# Patient Record
Sex: Female | Born: 1965 | Race: White | Hispanic: No | Marital: Single | Smoking: Never smoker
Health system: Southern US, Community
[De-identification: ages and names within clinical notes are randomized; demographics above are authoritative.]

## PROBLEM LIST (undated history)

## (undated) DIAGNOSIS — F419 Anxiety disorder, unspecified: Secondary | ICD-10-CM

## (undated) DIAGNOSIS — F32A Depression, unspecified: Secondary | ICD-10-CM

## (undated) DIAGNOSIS — F329 Major depressive disorder, single episode, unspecified: Secondary | ICD-10-CM

## (undated) HISTORY — PX: GALLBLADDER SURGERY: SHX652

---

## 2014-02-18 ENCOUNTER — Encounter (HOSPITAL_COMMUNITY): Payer: Self-pay | Admitting: Emergency Medicine

## 2014-02-18 ENCOUNTER — Emergency Department (HOSPITAL_COMMUNITY)
Admission: EM | Admit: 2014-02-18 | Discharge: 2014-02-19 | Disposition: A | Discharge status: Other Acute Inpatient Hospital | Payer: Medicare Other | Attending: Emergency Medicine | Admitting: Emergency Medicine

## 2014-02-18 DIAGNOSIS — R45851 Suicidal ideations: Secondary | ICD-10-CM | POA: Insufficient documentation

## 2014-02-18 DIAGNOSIS — F32A Depression, unspecified: Secondary | ICD-10-CM

## 2014-02-18 DIAGNOSIS — F329 Major depressive disorder, single episode, unspecified: Secondary | ICD-10-CM

## 2014-02-18 DIAGNOSIS — F3289 Other specified depressive episodes: Secondary | ICD-10-CM | POA: Insufficient documentation

## 2014-02-18 DIAGNOSIS — Z3202 Encounter for pregnancy test, result negative: Secondary | ICD-10-CM | POA: Insufficient documentation

## 2014-02-18 DIAGNOSIS — Z79899 Other long term (current) drug therapy: Secondary | ICD-10-CM | POA: Insufficient documentation

## 2014-02-18 HISTORY — DX: Major depressive disorder, single episode, unspecified: F32.9

## 2014-02-18 HISTORY — DX: Anxiety disorder, unspecified: F41.9

## 2014-02-18 HISTORY — DX: Depression, unspecified: F32.A

## 2014-02-18 LAB — SALICYLATE LEVEL: Salicylate Lvl: <2 mg/dL — ABNORMAL LOW (ref 2.8–20.0)

## 2014-02-18 LAB — CBC
HCT: 39.1 % (ref 36.0–46.0)
HEMOGLOBIN: 13 g/dL (ref 12.0–15.0)
MCH: 33.9 pg (ref 26.0–34.0)
MCHC: 33.2 g/dL (ref 30.0–36.0)
MCV: 101.8 fL — AB (ref 78.0–100.0)
Platelets: 392 10*3/uL (ref 150–400)
RBC: 3.84 MIL/uL — AB (ref 3.87–5.11)
RDW: 11.7 % (ref 11.5–15.5)
WBC: 9.9 10*3/uL (ref 4.0–10.5)

## 2014-02-18 LAB — COMPREHENSIVE METABOLIC PANEL
ALK PHOS: 80 U/L (ref 39–117)
ALT: 14 U/L (ref 0–35)
AST: 20 U/L (ref 0–37)
Albumin: 3.6 g/dL (ref 3.5–5.2)
BUN: 12 mg/dL (ref 6–23)
CO2: 24 meq/L (ref 19–32)
CREATININE: 0.73 mg/dL (ref 0.50–1.10)
Calcium: 9.1 mg/dL (ref 8.4–10.5)
Chloride: 103 mEq/L (ref 96–112)
GFR calc Af Amer: >90 mL/min (ref >90)
GFR calc non Af Amer: >90 mL/min (ref >90)
Glucose, Bld: 99 mg/dL (ref 70–99)
POTASSIUM: 4 meq/L (ref 3.7–5.3)
Sodium: 142 mEq/L (ref 137–147)
Total Protein: 7.4 g/dL (ref 6.0–8.3)

## 2014-02-18 LAB — RAPID URINE DRUG SCREEN, HOSP PERFORMED
Amphetamines: NOT DETECTED
BARBITURATES: NOT DETECTED
BENZODIAZEPINES: NOT DETECTED
Cocaine: NOT DETECTED
Opiates: NOT DETECTED
Tetrahydrocannabinol: NOT DETECTED

## 2014-02-18 LAB — POC URINE PREG, ED: Preg Test, Ur: NEGATIVE

## 2014-02-18 LAB — ETHANOL

## 2014-02-18 LAB — ACETAMINOPHEN LEVEL: Acetaminophen (Tylenol), Serum: <15 ug/mL (ref 10–30)

## 2014-02-18 MED ORDER — METOCLOPRAMIDE HCL 5 MG PO TABS
5.0000 mg | ORAL_TABLET | Freq: Three times a day (TID) | ORAL | Status: DC | PRN
Start: 2014-02-18 — End: 2014-02-19
  Filled 2014-02-18: qty 1

## 2014-02-18 MED ORDER — NICOTINE 21 MG/24HR TD PT24
21.0000 mg | MEDICATED_PATCH | Freq: Every day | TRANSDERMAL | Status: DC
  Filled 2014-02-18: qty 1

## 2014-02-18 MED ORDER — IBUPROFEN 400 MG PO TABS: 600.0000 mg | ORAL_TABLET | Freq: Three times a day (TID) | ORAL | Status: DC | PRN

## 2014-02-18 MED ORDER — ACETAMINOPHEN 325 MG PO TABS: 650.0000 mg | ORAL_TABLET | ORAL | Status: DC | PRN

## 2014-02-18 MED ORDER — LORAZEPAM 1 MG PO TABS: 1.0000 mg | ORAL_TABLET | Freq: Three times a day (TID) | ORAL | Status: DC | PRN

## 2014-02-18 NOTE — ED Notes (Signed)
PA-C at bedside 

## 2014-02-18 NOTE — ED Notes (Signed)
CN & AC notified of need for sitter

## 2014-02-18 NOTE — ED Notes (Signed)
The patient has been having suicidal thoughts with plans of shooting herself with a gun.  The patient has been battling depression her whole life.  She has seen a doctor and they prescription for Brintellix she just started taking it two days ago, 10mg  a day.  She usually has a trigger that causes her to be depressed and it is usually her brother.  She denies drug use or any other illegal substances.  The patient is here to be evaluated.

## 2014-02-18 NOTE — ED Provider Notes (Signed)
CSN: [id number]     Arrival date & time 02/18/14  1913 History   First MD Initiated Contact with Patient 02/18/14 2028     Chief Complaint  Patient presents with  . Suicidal    The patient has been having suicidal thoughts with plans of shooting herself with a gun.    (Consider location/radiation/quality/duration/timing/severity/associated sxs/prior Treatment) HPI Comments: Patient is a 48 year old female with history of depression who presents to the emergency department for suicidal thoughts. Patient states that her suicidal thoughts have been worsening over the last few weeks. She states she has been battling depression all of her life. She states she is been noncompliant with her medications recently because they have not been helping her. She does state she started taking Brintellix 2 days ago which she used to take for depression. Patient had a plan to overdose by pill ingestion or to shoot herself with a gun. Patient states that her parents took her gun away yesterday. She states that she is feeling more depressed because she is supposed to see her brother soon and she feels as though her brother dismissed her as a sibling when he learned of her psychiatric issues. Patient denies any alcohol or illicit drug use. She denies any homicidal ideation. She is requesting inpatient treatment for her depression and suicidal thoughts.  The history is provided by the patient. No language interpreter was used.    Past Medical History  Diagnosis Date  . Depression   . Anxiety    Past Surgical History  Procedure Laterality Date  . Gallbladder surgery     History reviewed. No pertinent family history. History  Substance Use Topics  . Smoking status: Never Smoker   . Smokeless tobacco: Never Used  . Alcohol Use: No   OB History   Grav Para Term Preterm Abortions TAB SAB Ect Mult Living                  Review of Systems  Psychiatric/Behavioral: Positive for suicidal ideas and behavioral  problems.  All other systems reviewed and are negative.    Allergies  Ambien; Haldol; Lunesta; Promethazine; and Trazodone and nefazodone  Home Medications   Prior to Admission medications   Medication Sig Start Date End Date Taking? Authorizing Provider  Norethindrone Acet-Ethinyl Est (MICROGESTIN 1/20 PO) Take 1 tablet by mouth daily.   Yes Historical Provider, MD  Olopatadine HCl (PATADAY) 0.2 % SOLN Place 2 drops into the left eye daily.   Yes Historical Provider, MD  triamcinolone cream (KENALOG) 0.1 % Apply 1 application topically 2 (two) times daily.   Yes Historical Provider, MD  Vortioxetine HBr (BRINTELLIX) 10 MG TABS Take 10 mg by mouth every morning.   Yes Historical Provider, MD   BP 127/75  Pulse 72  Temp(Src) 97.4 F (36.3 C) (Oral)  Resp 18  Ht 5\' 3"  (1.6 m)  Wt 146 lb (66.225 kg)  BMI 25.87 kg/m2  SpO2 98%  LMP 02/18/2014  Physical Exam  Nursing note and vitals reviewed. Constitutional: She is oriented to person, place, and time. She appears well-developed and well-nourished. No distress.  Nontoxic/nonseptic appearing  HENT:  Head: Normocephalic and atraumatic.  Eyes: Conjunctivae and EOM are normal. No scleral icterus.  Neck: Normal range of motion.  Cardiovascular: Normal rate, regular rhythm and normal heart sounds.   Pulmonary/Chest: Effort normal and breath sounds normal. No respiratory distress. She has no wheezes. She has no rales.  Abdominal: Soft. She exhibits no distension. There is no  tenderness.  Musculoskeletal: Normal range of motion.  Neurological: She is alert and oriented to person, place, and time.  GCS 15  Skin: Skin is warm and dry. No rash noted. She is not diaphoretic. No erythema. No pallor.  Psychiatric: Her speech is normal. Her mood appears anxious. She is withdrawn. Cognition and memory are normal. She exhibits a depressed mood. She expresses suicidal ideation. She expresses no homicidal ideation. She expresses suicidal plans. She  expresses no homicidal plans.    ED Course  Procedures (including critical care time) Labs Review Labs Reviewed  CBC - Abnormal; Notable for the following:    RBC 3.84 (*)    MCV 101.8 (*)    All other components within normal limits  COMPREHENSIVE METABOLIC PANEL - Abnormal; Notable for the following:    Total Bilirubin <0.2 (*)    All other components within normal limits  SALICYLATE LEVEL - Abnormal; Notable for the following:    Salicylate Lvl <2.0 (*)    All other components within normal limits  ETHANOL  ACETAMINOPHEN LEVEL  URINE RAPID DRUG SCREEN (HOSP PERFORMED)  POC URINE PREG, ED    Imaging Review No results found.   EKG Interpretation None      MDM   Final diagnoses:  Depression with suicidal ideation    Patient presents for depression with suicidal ideations. Labs reviewed and patient medically cleared. [facility] by Dr. Adela Glimpseabos.   Filed Vitals:   02/18/14 1944 02/19/14 0030  BP: 127/75   Pulse: 82 72  Temp: 98.1 F (36.7 C) 97.4 F (36.3 C)  TempSrc: Oral Oral  Resp: 18 18  Height: 5\' 3"  (1.6 m)   Weight: 146 lb (66.225 kg)   SpO2: 98% 98%        Antony MaduraKelly Beva Remund, PA-C 02/19/14 [phone]

## 2014-02-18 NOTE — ED Notes (Signed)
Press photographer and Nurse First notified of need for sitter

## 2014-02-19 ENCOUNTER — Encounter (HOSPITAL_COMMUNITY): Payer: Self-pay | Admitting: *Deleted

## 2014-02-19 ENCOUNTER — Inpatient Hospital Stay (HOSPITAL_COMMUNITY)
Admission: EM | Admit: 2014-02-19 | Discharge: 2014-02-25 | DRG: 885 | Disposition: A | Discharge status: Home / Self Care | Payer: Federal, State, Local not specified - Other | Source: Intra-hospital | Attending: Psychiatry | Admitting: Psychiatry

## 2014-02-19 DIAGNOSIS — Z598 Other problems related to housing and economic circumstances: Secondary | ICD-10-CM

## 2014-02-19 DIAGNOSIS — R45851 Suicidal ideations: Secondary | ICD-10-CM

## 2014-02-19 DIAGNOSIS — G4700 Insomnia, unspecified: Secondary | ICD-10-CM | POA: Diagnosis present

## 2014-02-19 DIAGNOSIS — Z5987 Material hardship due to limited financial resources, not elsewhere classified: Secondary | ICD-10-CM

## 2014-02-19 DIAGNOSIS — F332 Major depressive disorder, recurrent severe without psychotic features: Principal | ICD-10-CM | POA: Diagnosis present

## 2014-02-19 DIAGNOSIS — F4001 Agoraphobia with panic disorder: Secondary | ICD-10-CM | POA: Diagnosis present

## 2014-02-19 MED ORDER — MAGNESIUM HYDROXIDE 400 MG/5ML PO SUSP: 30.0000 mL | Freq: Every day | ORAL | Status: DC | PRN

## 2014-02-19 MED ORDER — IBUPROFEN 600 MG PO TABS
600.0000 mg | ORAL_TABLET | Freq: Three times a day (TID) | ORAL | Status: DC | PRN
  Administered 2014-02-21 – 2014-02-22 (×2): 600 mg via ORAL
  Filled 2014-02-19 (×2): qty 1

## 2014-02-19 MED ORDER — ESCITALOPRAM OXALATE 10 MG PO TABS
5.0000 mg | ORAL_TABLET | Freq: Every day | ORAL | Status: DC
  Administered 2014-02-21 – 2014-02-25 (×5): 5 mg via ORAL
  Filled 2014-02-19 (×3): qty 0.5
  Filled 2014-02-19: qty 1
  Filled 2014-02-19 (×3): qty 0.5
  Filled 2014-02-19: qty 7
  Filled 2014-02-19: qty 0.5

## 2014-02-19 MED ORDER — ACETAMINOPHEN 325 MG PO TABS
650.0000 mg | ORAL_TABLET | ORAL | Status: DC | PRN
  Administered 2014-02-21 – 2014-02-23 (×3): 650 mg via ORAL
  Filled 2014-02-19 (×3): qty 2

## 2014-02-19 MED ORDER — ASENAPINE MALEATE 5 MG SL SUBL
5.0000 mg | SUBLINGUAL_TABLET | Freq: Two times a day (BID) | SUBLINGUAL | Status: DC
  Administered 2014-02-19: 5 mg via SUBLINGUAL
  Filled 2014-02-19 (×6): qty 1

## 2014-02-19 MED ORDER — ALPRAZOLAM 0.5 MG PO TABS
0.5000 mg | ORAL_TABLET | Freq: Three times a day (TID) | ORAL | Status: DC
  Administered 2014-02-19: 0.5 mg via ORAL
  Filled 2014-02-19 (×2): qty 1

## 2014-02-19 MED ORDER — NICOTINE 21 MG/24HR TD PT24
21.0000 mg | MEDICATED_PATCH | Freq: Every day | TRANSDERMAL | Status: DC
Start: 2014-02-19 — End: 2014-02-19
  Filled 2014-02-19 (×2): qty 1

## 2014-02-19 NOTE — ED Provider Notes (Signed)
[facility] by Dr. Adela Glimpseabos.   Stephanie Boozeavid Riannah Stagner, MD 02/19/14 [phone]

## 2014-02-19 NOTE — [facility] Group Notes (Signed)
[facility] L[facility] Dis[facility] Planning [facility] Note   02/19/2014 9:54 AM    Participation Quality:  Appropraite  Mood/Affect:  Appropriate  Depression Rating:  10  Anxiety Rating:  10  Thoughts of Suicide:  No  Will you contract for safety?   NA  Current AVH:  No  Plan for Discharge/Comments:  Patient attended discharge planning group and actively participated in group.  She advised she [facility] [place].  CSW provided all participants with daily workbook.   Transportation Means: Patient has transportation.   Supports:  Patient has a support system.   Genever Hentges, Joesph JulyQuylle Hairston

## 2014-02-19 NOTE — Progress Notes (Signed)
D Stephanie Lowery is OOB UAL on the 500 hall today...she tolerates this VERY quietly and without attention bought to her. She avoids eye contact whenever she can. She avoids confrontation and does not talk much with other patients.   A She attends her groups. She takes her meds as ordered  and requests  She take tomorrow's saphris at bedtime...saying her 17oo dose is " too much" for her...that she cannot hold her  Her eyes open. This nurse explained to her  we could  Give later if she so requests and that nurse will f/u with her Saturday 02/20/14 per her request.   R Pt completed morning assessment and on it wrote she cont to have SI and sahe contracts with this nurse to stay safe. She rates her depression and hopelessness "1/1" and writes " no idea" under what can she change to help herslef this time. Dr. Marilynn LatinoKobos identifies her lack of social support as one of her biggest problems . Pt identifies conflict with brother as stressor. Will reassess saphri effectiveness tomorrow.

## 2014-02-19 NOTE — BHH Counselor (Signed)
Adult Comprehensive Assessment  Patient ID: Stephanie Clevelanderri Hejl, female   DOB: [birth year], 48 y.o.   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 Family Relationships: Patient reports having a bad relationship with her brother Surveyor, quantityinancial / Lack of resources (include bankruptcy): None Housing / Lack of housing: None Physical health (include injuries & life threatening diseases): Headaces and insomnia Social relationships: None Substance abuse: None Bereavement / Loss: Grandmother died two months ago  Living/Environment/Situation:  Living Arrangements: Parent Living conditions (as described by patient or guardian): Good How long has patient lived in current situation?: 15 years What is atmosphere in current home: Comfortable;Loving;Supportive  Family History:  Marital status: Divorced Divorced, when?: over 25 yers What types of issues is patient dealing with in the relationship?: N/A Additional relationship information: N/A Does patient have children?: No  Childhood History:  By whom was/is the patient raised?: Both parents Additional childhood history information: Does not remember much but a loner Description of patient's relationship with caregiver when they were a child: Close Patient's description of current relationship with people who raised him/her: Good relationship but feels she is putting a strain  Does patient have siblings?: Yes Number of Siblings: 1 Description of patient's current relationship with siblings: Strained relationship with older brother Did patient suffer any verbal/emotional/physical/sexual abuse as a child?: No Did patient suffer from severe childhood neglect?: No Has patient ever been sexually abused/assaulted/raped as an adolescent or adult?: No Was the patient ever a victim of a crime or a disaster?: No Witnessed domestic  violence?: No Has patient been effected by domestic violence as an adult?: No  Education:  Highest grade of school patient has completed: 12th grade Currently a student?: No Learning disability?: No  Employment/Work Situation:   Employment situation: On disability Why is patient on disability: Mental Health How long has patient been on disability: March 2012 Patient's job has been impacted by current illness: No What is the longest time patient has a held a job?: six years Where was the patient employed at that time?: [employer] Has patient ever been in the [employer] and Companymilitary?: No Has patient ever served in Buyer, retailcombat?: No  Financial Resources:   Financial resources: Insurance claims handlereceives SSDI Does patient have a Lawyerrepresentative payee or guardian?: No  Alcohol/Substance Abuse:   What has been your use of drugs/alcohol within the last 12 months?: Patient denies If attempted suicide, did drugs/alcohol play a role in this?: No Alcohol/Substance Abuse Treatment Hx: Denies past history Has alcohol/substance abuse ever caused legal problems?: No  Social Support System:   Forensic psychologistatient's Community Support System: None Describe Community Support System: N/A Type of faith/religion: Ephriam KnucklesChristian How does patient's faith help to cope with current illness?: Prayer and religious music  Leisure/Recreation:   Leisure and Hobbies: None  Strengths/Needs:   What things does the patient do well?: Very caring person In what areas does patient struggle / problems for patient: Trust  Discharge Plan:   Does patient have access to transportation?: Yes Will patient be returning to same living situation after discharge?: Yes Currently receiving community mental health services: Yes (From Whom) ([facility] [place]) If no, would patient like referral for services when discharged?: Yes (What county?) Does patient have financial barriers related to discharge medications?: Yes Patient description  of barriers related to discharge medications: Patient advised of needing assistance with mediations. due to insurance not paying well  Summary/Recommendations:  Stephanie Lowery is a 89 years old Caucasian female admitted with Major Depression Disorder.  She will benefit from crisis stabilization, evaluation for medication, psycho-education groups for coping skills development, group therapy and case management for discharge planning.     Niara Bunker, Joesph JulyQuylle Hairston. 02/19/2014

## 2014-02-19 NOTE — Tx Team (Signed)
Interdisciplinary Treatment Plan Update   Date Reviewed:  02/19/2014  Time Reviewed:  8:41 AM  Progress in Treatment:   Attending groups: Yes Participating in groups: Yes Taking medication as prescribed: Yes  Tolerating medication: Yes Family/Significant other contact made:  No, but will ask patient for consent for collateral contact Patient understands diagnosis: Yes  Discussing patient identified problems/goals with staff: Yes Medical problems stabilized or resolved: Yes Denies suicidal/homicidal ideation: Yes Patient has not harmed self or others: Yes  For review of initial/current patient goals, please see plan of care.  Estimated Length of Stay:  3-5 days  Reasons for Continued Hospitalization:  Anxiety Depression Medication stabilization Suicidal ideation  New Problems/Goals identified:    Discharge Plan or Barriers:   Home with outpatient follow up with [facility] [place]  Additional Comments:   Stephanie Clevelanderri Hacking is a 48 y.o. female who [facility]/depression and anxiety. Pt reports the following: she is SI with a plan to shoot self, stating she has friends who have guns and she has access to them. Pt says she's been SI x302mos and reports stressors that have attributed to her current mental state:(1) unemployed; (2) had to moved home with parents; and (3) problems with brother--"this is a huge trigger for me". Pt says she's attempted SI at least 6x's by overdose, she says he's been struggling with depression all her life. Pt denies HI/AVH/SA, she says was engaged in outpatient services with Dr. Jama Flavorsobos and has now [facility] mg[facility] and therapy. Pt says she going to begin ECT therapy on 02/19/14 but couldn't wait and came to the emerg dept for help. Pt has a cutting hx in her 20's but has not cut since then. Pt is tearful during the interview; she is endorsing depressive sxs: daily  crying spells, poor appetite, anger/irritability and isolation and anxiety w/panic attacks--"they are bad   Attendees:  Patient:  02/19/2014 8:41 AM   Signature:  [name], MD 02/19/2014 8:41 AM  Signature:  02/19/2014 8:41 AM  Signature:  [name], RN 02/19/2014 8:41 AM  Signature: [name], RN 02/19/2014 8:41 AM  Signature: [name], RN 02/19/2014 8:41 AM  Signature:  [name], LCSW 02/19/2014 8:41 AM  Signature:  [name], LCSW 02/19/2014 8:41 AM  Signature:  [name], Care Coordinator [facility] 02/19/2014 8:41 AM  Signature:   02/19/2014 8:41 AM  Signature:  02/19/2014  8:41 AM  Signature:   [name], RN [facility] 02/19/2014  8:41 AM  Signature: 02/19/2014  8:41 AM    Scribe for Treatment Team:   [name],  02/19/2014 8:41 AM

## 2014-02-19 NOTE — BH Assessment (Signed)
Tele Assessment Note   Stephanie Lowery is a 48 y.o. female who [facility]/depression and anxiety.  Pt reports the following: she is SI with a plan to shoot self, stating she has friends who have guns and she has access to them.  Pt says she's been SI x662mos and reports stressors that have attributed to her current mental state:(1) unemployed; (2) had to moved home with parents; and (3) problems with brother--"this is a huge trigger for me".  Pt says she's attempted SI at least 6x's by overdose, she says he's been struggling with depression all her life. Pt denies HI/AVH/SA, she says was engaged in outpatient services with Dr. Jama Flavorsobos and has now [facility] and therapy.  Pt says she going to begin ECT therapy on 02/19/14 but couldn't wait and came to the emerg dept for help.  Pt has a cutting hx in her 20's but has not cut since then.  Pt is tearful during the interview; she is endorsing depressive sxs: daily crying spells, poor appetite, anger/irritability and isolation and anxiety w/panic attacks--"they are bad".  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 Depression   . Anxiety    Axis IV: occupational problems, other psychosocial or environmental problems, problems related to social environment and problems with primary support group Axis V: 31-40 impairment in reality testing  Past Medical History:  Past Medical History  Diagnosis Date  . Depression   . Anxiety     Past Surgical History  Procedure Laterality Date  . Gallbladder surgery      Family History: History reviewed. No pertinent family history.  Social History:  reports that she has never smoked. She has never used smokeless tobacco. She reports that she does not drink alcohol or use illicit  drugs.  Additional Social History:  Alcohol / Drug Use Pain Medications: See MAR  Prescriptions: See MAR  Over the Counter: See MAR  History of alcohol / drug use?: No history of alcohol / drug abuse Longest period of sobriety (when/how long): Pt denies   CIWA: CIWA-Ar BP: 127/75 mmHg Pulse Rate: 72 COWS:    Allergies:  Allergies  Allergen Reactions  . Ambien [Zolpidem Tartrate] Other (See Comments)    jittery  . Haldol [Haloperidol Lactate]   . Lunesta [Eszopiclone]     Jittery   . Promethazine     jittery  . Trazodone And Nefazodone Other (See Comments)    Migraines     Home Medications:  (Not in a hospital admission)  OB/GYN Status:  Patient's last menstrual period was 02/18/2014.  General Assessment Data [facility] Is this a Tele or Face-to-Face Assessment?: Tele Assessment Is this an Initial Assessment or a Re-assessment for this encounter?: Initial Assessment Living Arrangements: Parent (Currently lives with parents ) Can pt return to current living arrangement?: Yes Admission Status: Voluntary Is patient capable of signing voluntary admission?: Yes Transfer from: Acute Hospital Referral Source: MD  Medical Screening Exam [facility] Walk-in ONLY) Medical Exam completed: No Reason for MSE not completed: Other: (None )  [facility] Crisis [facility] Living Arrangements: Parent (Currently lives with parents ) [facility]  [facility] Therapist[facility]   Education Status  Is patient currently in school?: No Current Grade: None  Highest grade of school patient has completed: None  Name of school: None  Contact person: none   Risk to self Suicidal Ideation: Yes-Currently Present Suicidal Intent: Yes-Currently Present Is patient at risk for suicide?: Yes Suicidal Plan?: Yes-Currently Present Specify Current Suicidal Plan: Shoot self with a gun  Access to Means: Yes Specify Access to  Suicidal Means: Pt has access to guns  What has been your use of drugs/alcohol within the last 12 months?: Pt denies  Previous Attempts/Gestures: Yes How many times?: 6 Other Self Harm Risks: None  Triggers for Past Attempts: Family contact;Unpredictable Intentional Self Injurious Behavior: Cutting Comment - Self Injurious Behavior: Pt has past hx of cutting behaviors--20's Family Suicide History: No Recent stressful life event(s): Job Loss;Conflict (Comment) (Issues with brother; living with parents ) Persecutory voices/beliefs?: No Depression: Yes Depression Symptoms: Insomnia;Tearfulness;Isolating;Loss of interest in usual pleasures;Feeling worthless/self pity;Feeling angry/irritable Substance abuse history and/or treatment for substance abuse?: No Suicide prevention information given to non-admitted patients: Not applicable  Risk to Others Homicidal Ideation: No Thoughts of Harm to Others: No Current Homicidal Intent: No Current Homicidal Plan: No Access to Homicidal Means: No Identified Victim: None  History of harm to others?: No Assessment of Violence: None Noted Violent Behavior Description: None  Does patient have access to weapons?: No Criminal Charges Pending?: No Does patient have a court date: No  Psychosis Hallucinations: None noted Delusions: None noted  Mental Status Report Appear/Hygiene: In scrubs Eye Contact: Good Motor Activity: Unremarkable Speech: Logical/coherent Level of Consciousness: Alert Mood: Depressed;Sad Affect: Depressed;Sad Anxiety Level: None Thought Processes: Coherent;Relevant Judgement: Impaired Orientation: Person;Place;Time;Situation Obsessive Compulsive Thoughts/Behaviors: None  Cognitive Functioning Impulse Control: Fair Appetite: Poor Weight Loss:  (Unk ) Weight Gain:  (None ) Sleep: Decreased Total Hours of Sleep: 2 Vegetative Symptoms: None  ADLScreening [facility] Assessment Services) Patient's cognitive ability adequate  to safely complete daily activities?: Yes Patient able to express need for assistance with ADLs?: Yes Independently performs ADLs?: Yes (appropriate for developmental age)  Prior Inpatient Therapy Prior Inpatient Therapy: Yes Prior Therapy Dates: Unk  [facility]  Reason for Treatment: SI/depression   Prior Outpatient Therapy Prior Outpatient Therapy: Yes Prior Therapy Dates: Current  [facility]  Reason for Treatment: Med Mgt/Therapy   ADL Screening (condition at time of admission) Patient's cognitive ability adequate to safely complete daily activities?: Yes Is the patient deaf or have difficulty hearing?: No Does the patient have difficulty seeing, even when wearing glasses/contacts?: No Does the patient have difficulty concentrating, remembering, or making decisions?: Yes Patient able to express need for assistance with ADLs?: Yes Does the patient have difficulty dressing or bathing?: No Independently performs ADLs?: Yes (appropriate for developmental age) Does the patient have difficulty walking or climbing stairs?: No Weakness of Legs: None Weakness of Arms/Hands: None  Home Assistive Devices/Equipment Home Assistive Devices/Equipment: None  Therapy Consults (therapy consults require a physician order) PT Evaluation Needed: No OT Evalulation Needed: No SLP Evaluation Needed: No Abuse/Neglect Assessment (Assessment to be complete while patient is alone) Physical Abuse: Denies Verbal Abuse: Denies Sexual Abuse: Denies Exploitation of patient/patient's resources: Denies Self-Neglect: Denies Values / Beliefs Cultural Requests During Hospitalization: None Spiritual Requests During Hospitalization: None Consults Spiritual Care Consult Needed: No Social Work Consult Needed: No Merchant navy officerAdvance Directives (For Healthcare) Advance Directive: Patient does not have advance  directive Pre-existing out of facility DNR order (yellow form  or pink MOST form): No Nutrition Screen- [facility] Adult/WL/AP Patient's home diet: Regular  Additional Information 1:1 In Past 12 Months?: No CIRT Risk: No Elopement Risk: No Does patient have medical clearance?: Yes     Disposition:  Disposition Disposition of Patient: Inpatient treatment program;Referred to [facility] ) Type of inpatient treatment program: Adult Patient referred to: Other (Comment) ([facility] )  Murrell Redden 02/19/2014 1:01 AM

## 2014-02-19 NOTE — [facility] Suicide Risk Assessment (Signed)
   Nursing information obtained from:  Patient;Review of record Demographic factors:  Caucasian;Unemployed;Access to firearms Current Mental Status:  Suicidal ideation indicated by patient;Suicide plan;Plan includes specific time, place, or method;Self-harm thoughts;Intention to act on suicide plan;Belief that plan would result in death Loss Factors:  Decrease in vocational status;Loss of significant relationship Historical Factors:  Prior suicide attempts Risk Reduction Factors:  Sense of responsibility to family;Living with another person, especially a relative;Positive social support;Positive therapeutic relationship Total Time spent with patient: 45 minutes  CLINICAL FACTORS:   Severe Anxiety and/or Agitation Panic Attacks Depression Psychiatric Specialty Exam: Physical Exam  ROS  Blood pressure 117/76, pulse 78, temperature 98.3 F (36.8 C), temperature source Oral, resp. rate 16, height 5\' 3"  (1.6 m), weight 65.772 kg (145 lb), last menstrual period 02/18/2014.Body mass index is 25.69 kg/(m^2).  SEE ADMIT MSE   COGNITIVE FEATURES THAT CONTRIBUTE TO RISK:  Closed-mindedness    SUICIDE RISK:   Severe:  Frequent, intense, and enduring suicidal ideation, specific plan, no subjective intent, but some objective markers of intent (i.e., choice of lethal method), the method is accessible, some limited preparatory behavior, evidence of impaired self-control, severe dysphoria/symptomatology, multiple risk factors present, and few if any protective factors, particularly a lack of social support.  PLAN OF CARE: Patient will be admitted to inpatient psychiatric unit for stabilization and safety. Will provide and encourage milieu participation. Provide medication management and maked adjustments as needed.  Will follow daily.    I certify that inpatient services furnished can reasonably be expected to improve the patient's condition.  COBOS, FERNANDO 02/19/2014, 3:17 PM

## 2014-02-19 NOTE — [facility] Group Notes (Signed)
[facility] LCSW Group Therapy  Feelings Around Relapse 1:15 -2:30        02/19/2014   Type of Therapy:  Group Therapy  Participation Level:  Appropriate  Participation Quality:  Appropriate  Affect: Depressed  Cognitive:  Attentive Appropriate  Insight:  Developing/Improving  Engagement in Therapy: Developing/Improving  Modes of Intervention:  Discussion Exploration Problem-Solving Supportive  Summary of Progress/Problems:  The topic for today was feelings around relapse.    Patient processed feelings toward relapse and was able to relate to peers. She advised of isolating and having negative thoughts.  Patient identified coping skills that can be used to prevent a relapse.   Wynn BankerHodnett, Stephanie Lowery 02/19/2014

## 2014-02-19 NOTE — Progress Notes (Signed)
Pt vol admitted after [facility]. Pt expressing SI to shoot herself x 2 months. Reports 6 previous OD attempts. She was to begin ECT today (02/19/14) through [facility] (in [place] where she was followed by Dr. [facility]. Pt recently lost job which caused her to move in with her parents who are primary support. She also states she no longer has a relationship with her brother and feels he disowned her because of her mental illness causing her great pain. Pt was started on brintellix for her depression a few weeks ago however it was stopped due to extreme nausea in the mornings. Pt now states the nausea was due to her gall bladder which was removed 2 weeks ago and the brintellix has been resumed x 2 days. Pt states she has dizzy spells at times therefore made moderate fall risk. Reports hx of severe migraines which require IV pain management as well as severe constipation (LBM 02/18/14) and eczema. Pt oriented to unit, meal and fluids provided. Level III obs initiated and in place. Fall precautions explained and pt verbalized understanding. When asked about substance abuse patient denied however did state that she overtakes ativan in the amount of 4-7mg  BID for "control of my anxiety and my inability to sleep" with last use 02/18/14 at 1700. Pt states she has been taking the ativan in this pattern for 2 weeks however UDS is negative for benzos (and all other substances). No physical complaints at this time. Pt is anxious and depressed however denies SI/HI/AVH and is safely resting in bed at this time. [name], Stephanie Lowery

## 2014-02-19 NOTE — Tx Team (Signed)
Initial Interdisciplinary Treatment Plan  PATIENT STRENGTHS: (choose at least two) Average or above average intelligence Communication skills General fund of knowledge Motivation for treatment/growth Physical Health Supportive family/friends  PATIENT STRESSORS: Loss of relationship with brother   PROBLEM LIST: Problem List/Patient Goals Date to be addressed Date deferred Reason deferred Estimated date of resolution  Anxiety 02/19/14     Depression 02/19/14     SI 02/19/14     Moderate fall risk 02/19/14                                    DISCHARGE CRITERIA:  Improved stabilization in mood, thinking, and/or behavior Need for constant or close observation no longer present Reduction of life-threatening or endangering symptoms to within safe limits Verbal commitment to aftercare and medication compliance  PRELIMINARY DISCHARGE PLAN: Outpatient therapy Return to previous living arrangement  PATIENT/FAMIILY INVOLVEMENT: This treatment plan has been presented to and reviewed with the patient, Stephanie Lowery, and/or family member.  The patient and family have been given the opportunity to ask questions and make suggestions.  Merian CapronFriedman, Erendira Crabtree [facility] 02/19/2014, 4:04 AM

## 2014-02-19 NOTE — [facility] Suicide Risk Assessment (Addendum)
[facility] INPATIENT:  Family/Significant Other Suicide Prevention Education  Suicide Prevention Education:  Education Completed; Alyson Reedynn Pry, Mother, [phone]; has been identified by the patient as the family member/significant other with whom the patient will be residing, and identified as the person(s) who will aid the patient in the event of a mental health crisis (suicidal ideations/suicide attempt).  With written consent from the patient, the family member/significant other has been provided the following suicide prevention education, prior to the and/or following the discharge of the patient.  The suicide prevention education provided includes the following:  Suicide risk factors  Suicide prevention and interventions  National Suicide Hotline telephone number  [facility] assessment telephone number  [facility] Emergency Assistance 911  [facility] and/or Residential Mobile Crisis Unit telephone number  Request made of family/significant other to:  Remove weapons (e.g., guns, rifles, knives), all items previously/currently identified as safety concern. Mother advised patient does not have access to weapons.     Remove drugs/medications (over-the-counter, prescriptions, illicit drugs), all items previously/currently identified as a safety concern. Vaibhav Fogleman, CSW spoke with pt's mother again on 02/24/14 @ 12:22 pm to verify that she disposed of pt's pills hidden under her bed.  Mother states that she did get rid of them.    The family member/significant other verbalizes understanding of the suicide prevention education information provided.  The family member/significant other agrees to remove the items of safety concern listed above.  Wynn BankerHodnett, Quylle Hairston 02/19/2014, 3:01 PM
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
## 2014-02-19 NOTE — Progress Notes (Signed)
Adult Psychoeducational Group Note  Date:  02/19/2014 Time:  9:21 PM  Group Topic/Focus:  Wrap-Up Group:   The focus of this group is to help patients review their daily goal of treatment and discuss progress on daily workbooks.  Participation Level:  Did Not Attend  Additional Comments:  Pt did not attend group due to being in bed asleep.   Cathlean CowerClouse, Orvile Corona Y 02/19/2014, 9:21 PM

## 2014-02-20 DIAGNOSIS — F332 Major depressive disorder, recurrent severe without psychotic features: Principal | ICD-10-CM

## 2014-02-20 DIAGNOSIS — Z8659 Personal history of other mental and behavioral disorders: Secondary | ICD-10-CM

## 2014-02-20 DIAGNOSIS — F3160 Bipolar disorder, current episode mixed, unspecified: Secondary | ICD-10-CM

## 2014-02-20 MED ORDER — BUSPIRONE HCL 5 MG PO TABS
5.0000 mg | ORAL_TABLET | Freq: Two times a day (BID) | ORAL | Status: DC
  Administered 2014-02-20 – 2014-02-25 (×10): 5 mg via ORAL
  Filled 2014-02-20 (×8): qty 1
  Filled 2014-02-20: qty 28
  Filled 2014-02-20: qty 1
  Filled 2014-02-20: qty 28
  Filled 2014-02-20 (×5): qty 1

## 2014-02-20 MED ORDER — HYDROXYZINE HCL 50 MG PO TABS
50.0000 mg | ORAL_TABLET | Freq: Every day | ORAL | Status: DC
Start: 2014-02-20 — End: 2014-02-25
  Administered 2014-02-20 – 2014-02-24 (×5): 50 mg via ORAL
  Filled 2014-02-20 (×5): qty 1
  Filled 2014-02-20: qty 14
  Filled 2014-02-20: qty 1

## 2014-02-20 NOTE — Progress Notes (Signed)
[facility] MD Progress Note  02/20/2014 10:39 AM Stephanie Lowery  MRN:  [id number] Subjective:  Pt seen and chart viewed. Pt denies HI, and AVH, contracts for safety, but affirms strong feelings of SI with a plan to overdose if she were outside of [facility]. Pt states that her anxiety is 10/10 and her depression is also 10/10. Pt is participating in group therapy. Pt also reports that her Saphris is causing side effects such as tremor and severe malaise. The tremor is visible to this NP before subjective reporting.   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 She is now followed by Dr. Penelope Galas at above mentioned hospital.Patient contacted me yesterday  By calling [facility] operator, and reported that she was having suicidal ideations, with a thought of shooting herself, and wanted to find a gun at home ( family apparently owns weapons, but have hidden them from her). Because of this,I recommended she be admitted, and family brought her to ER- now admitted.Patient states she had been on waiting list to start outpatient ECT at [facility], but a recent cholecistectomy for cholelithiasis and recovery from this procedure postponed /delayed ECT treatment so she has not started it yet. Patient stopped taking her medications  ( Tegretol and Prozac )  Several days ago " because they were not working for me". (+) neuro vegetative symptoms.   Diagnosis:   DSM5:  Depressive Disorders:  Major Depressive Disorder - Severe (296.23) Total Time spent with patient: 45 minutes  AXIS I:  Bipolar, mixed  Versus MDD , Severe, Recurrent. History of Panic Disorder AXIS II:  Cluster B Traits Axis III:  Past Medical History  Diagnosis Date  . Depression   . Anxiety    Axis IV: other psychosocial or environmental problems and problems related to social environment Axis V: 21-30 behavior considerably  influenced by delusions or hallucinations OR serious impairment in judgment, communication OR inability to function in almost all areas  ADL's:  Intact  Sleep: Good  Appetite:  Good  Suicidal Ideation:  Denies Homicidal Ideation:  Denies AEB (as evidenced by):  Psychiatric Specialty Exam: Physical Exam  Review of Systems  Constitutional: Negative.   HENT: Negative.   Eyes: Negative.   Respiratory: Negative.   Cardiovascular: Negative.   Gastrointestinal: Negative.   Genitourinary: Negative.   Musculoskeletal: Negative.   Skin: Negative.   Neurological: Negative.   Endo/Heme/Allergies: Negative.   Psychiatric/Behavioral: Positive for depression. The patient is nervous/anxious.     Blood pressure 131/92, pulse 73, temperature 98 F (36.7 C), temperature source Oral, resp. rate 16, height _0  (1.6 m), weight 65.772 kg (145 lb), last menstrual period 02/18/2014.Body mass index is 25.69 kg/(m^2).   General Appearance: Fairly Groomed  Engineer, water::  Good  Speech:  Normal Rate  Volume:  Normal  Mood:  Depressed and Irritable  Affect:  Constricted and Labile  Thought Process:  Linear  Orientation:  Full (Time, Place, and Person)  Thought Content:  no psychotic symptoms   Suicidal Thoughts:  Yes.  with intent/plan- contracts for safety but affirms plan to OD if she were outpatient   Homicidal Thoughts:  No  Memory:  NA  Judgement:  Fair  Insight:  Fair  Psychomotor Activity:  Normal  Concentration:  Good  Recall:  Good  Fund of Knowledge:Good  Language: Good  Akathisia:  No  Handed:  Right  AIMS (if indicated):     Assets:  Desire for Improvement Social Support  Sleep:  Number of Hours: 5.75    Musculoskeletal: Strength & Muscle Tone: within normal limits Gait & Station: normal Patient leans: N/A  Current Medications: Current Facility-Administered Medications  Medication Dose Route Frequency Provider Last Rate Last Dose  . acetaminophen (TYLENOL) tablet  650 mg  650 mg Oral Q4H PRN Lurena Nida, NP      . ALPRAZolam Duanne Moron) tablet 0.5 mg  0.5 mg Oral TID Neita Garnet, MD   0.5 mg at 02/19/14 2140  . asenapine (SAPHRIS) sublingual tablet 5 mg  5 mg Sublingual BID Neita Garnet, MD   5 mg at 02/19/14 1714  . escitalopram (LEXAPRO) tablet 5 mg  5 mg Oral Daily Neita Garnet, MD      . ibuprofen (ADVIL,MOTRIN) tablet 600 mg  600 mg Oral Q8H PRN Lurena Nida, NP      . magnesium hydroxide (MILK OF MAGNESIA) suspension 30 mL  30 mL Oral Daily PRN Lurena Nida, NP        Lab Results:  Results for orders placed during the hospital encounter of 02/18/14 (from the past 48 hour(s))  CBC     Status: Abnormal   Collection Time    02/18/14  7:50 PM      Result Value Ref Range   WBC 9.9  4.0 - 10.5 K/uL   RBC 3.84 (*) 3.87 - 5.11 MIL/uL   Hemoglobin 13.0  12.0 - 15.0 g/dL   HCT 39.1  36.0 - 46.0 %   MCV 101.8 (*) 78.0 - 100.0 fL   MCH 33.9  26.0 - 34.0 pg   MCHC 33.2  30.0 - 36.0 g/dL   RDW 11.7  11.5 - 15.5 %   Platelets 392  150 - 400 K/uL  COMPREHENSIVE METABOLIC PANEL     Status: Abnormal   Collection Time    02/18/14  7:50 PM      Result Value Ref Range   Sodium 142  137 - 147 mEq/L   Potassium 4.0  3.7 - 5.3 mEq/L   Chloride 103  96 - 112 mEq/L   CO2 24  19 - 32 mEq/L   Glucose, Bld 99  70 - 99 mg/dL   BUN 12  6 - 23 mg/dL   Creatinine, Ser 0.73  0.50 - 1.10 mg/dL   Calcium 9.1  8.4 - 10.5 mg/dL   Total Protein 7.4  6.0 - 8.3 g/dL   Albumin 3.6  3.5 - 5.2 g/dL   AST 20  0 - 37 U/L   ALT 14  0 - 35 U/L   Alkaline Phosphatase 80  39 - 117 U/L   Total Bilirubin <0.2 (*) 0.3 - 1.2 mg/dL   GFR calc non Af Amer >90  >90 mL/min   GFR calc Af Amer >90  >90 mL/min   Comment: (NOTE)     The eGFR has been calculated using the CKD EPI equation.     This calculation has not been validated in all clinical situations.     eGFR's persistently <90 mL/min signify possible Chronic Kidney     Disease.  ETHANOL     Status: None   Collection  Time    02/18/14  7:50 PM      Result Value Ref Range   Alcohol, Ethyl (B) <11  0 - 11 mg/dL   Comment:  LOWEST DETECTABLE LIMIT FOR     SERUM ALCOHOL IS 11 mg/dL     FOR MEDICAL PURPOSES ONLY  ACETAMINOPHEN LEVEL     Status: None   Collection Time    02/18/14  7:50 PM      Result Value Ref Range   Acetaminophen (Tylenol), Serum <15.0  10 - 30 ug/mL   Comment:            THERAPEUTIC CONCENTRATIONS VARY     SIGNIFICANTLY. A RANGE OF 10-30     ug/mL MAY BE AN EFFECTIVE     CONCENTRATION FOR MANY PATIENTS.     HOWEVER, SOME ARE BEST TREATED     AT CONCENTRATIONS OUTSIDE THIS     RANGE.     ACETAMINOPHEN CONCENTRATIONS     >150 ug/mL AT 4 HOURS AFTER     INGESTION AND >50 ug/mL AT 12     HOURS AFTER INGESTION ARE     OFTEN ASSOCIATED WITH TOXIC     REACTIONS.  SALICYLATE LEVEL     Status: Abnormal   Collection Time    02/18/14  7:50 PM      Result Value Ref Range   Salicylate Lvl <0.3 (*) 2.8 - 20.0 mg/dL  URINE RAPID DRUG SCREEN (HOSP PERFORMED)     Status: None   Collection Time    02/18/14  8:22 PM      Result Value Ref Range   Opiates NONE DETECTED  NONE DETECTED   Cocaine NONE DETECTED  NONE DETECTED   Benzodiazepines NONE DETECTED  NONE DETECTED   Amphetamines NONE DETECTED  NONE DETECTED   Tetrahydrocannabinol NONE DETECTED  NONE DETECTED   Barbiturates NONE DETECTED  NONE DETECTED   Comment:            DRUG SCREEN FOR MEDICAL PURPOSES     ONLY.  IF CONFIRMATION IS NEEDED     FOR ANY PURPOSE, NOTIFY LAB     WITHIN 5 DAYS.                LOWEST DETECTABLE LIMITS     FOR URINE DRUG SCREEN     Drug Class       Cutoff (ng/mL)     Amphetamine      1000     Barbiturate      200     Benzodiazepine   491     Tricyclics       791     Opiates          300     Cocaine          300     THC              50  POC URINE PREG, ED     Status: None   Collection Time    02/18/14  8:27 PM      Result Value Ref Range   Preg Test, Ur NEGATIVE  NEGATIVE   Comment:             THE SENSITIVITY OF THIS     METHODOLOGY IS >24 mIU/mL    Physical Findings: AIMS: Facial and Oral Movements Muscles of Facial Expression: None, normal Lips and Perioral Area: None, normal Jaw: None, normal Tongue: None, normal,Extremity Movements Upper (arms, wrists, hands, fingers): None, normal Lower (legs, knees, ankles, toes): None, normal, Trunk Movements Neck, shoulders, hips: None, normal, Overall Severity Severity of abnormal movements (highest score from questions above): None, normal Incapacitation due  to abnormal movements: None, normal Patient's awareness of abnormal movements (rate only patient's report): No Awareness, Dental Status Current problems with teeth and/or dentures?: No Does patient usually wear dentures?: No  CIWA:    COWS:     Treatment Plan Summary: Daily contact with patient to assess and evaluate symptoms and progress in treatment Medication management  Plan: Review of chart, vital signs, medications, and notes.  1-Individual and group therapy  2-Medication management for depression and anxiety: Medications reviewed with the patient and she stated no untoward effects. -Discontinue Saphris (pt cites tremor which is visible to this NP, also cites feelings of severe malaise) -Start Buspar 86m bid for mood stabilization (pt reports great success with this historically)  3-Coping skills for depression, anxiety  4-Continue crisis stabilization and management  5-Address health issues--monitoring vital signs, stable  6-Treatment plan in progress to prevent relapse of depression and anxiety  Medical Decision Making Problem Points:  Established problem, stable/improving (1), Review of last therapy session (1) and Review of psycho-social stressors (1) Data Points:  Review or order clinical lab tests (1) Review or order medicine tests (1) Review of medication regiment & side effects (2) Review of new medications or change in dosage (2)  I  certify that inpatient services furnished can reasonably be expected to improve the patient's condition.   WBenjamine Mola FNP-BC 02/20/2014, 10:39 AM    Patient seen, evaluated and I agree with notes by Nurse Practitioner. MCorena Pilgrim MD

## 2014-02-20 NOTE — [facility] Group Notes (Signed)
[facility] Group Notes:  (Clinical Social Work)  02/20/2014   1:15-2:45PM  Summary of Progress/Problems:   The main focus of today's process group was for the patient to identify ways in which they have sabotaged their own mental health wellness/recovery.  Motivational interviewing and a handout were used to explore the benefits and costs of their self-sabotaging behavior as well as the benefits and costs of changing this behavior.  The Stages of Change were explained to the group using a handout, and patients identified where they are with regard to changing self-defeating behaviors.  The patient expressed she self-sabotages with suiicide attempts, has tried various methods, was attempting to locate a gun to shoot herself with on this particular occasion.  She was tearful for much of group, and shook her head numerous times as though to indicate lack of understanding, so CSW undertook further explanations which eventually did help her to comprehend points being made by both CSW and other patients.  She expressed a great deal of frustration with her life not turning out like she thought it, for instance having a husband and children she could watch grow.  As she talked about what other people have in their lives in comparison to her, other patients thanked her for sharing because it helped them to know they are not alone.  This made her smile.  Type of Therapy:  Process Group  Participation Level:  Active  Participation Quality:  Appropriate, Sharing and Supportive  Affect:  Anxious, Depressed, Flat and Tearful  Cognitive:  Alert  Insight:  Developing/Improving  Engagement in Therapy:  Engaged  Modes of Intervention:  Education, Motivational Interviewing   Ambrose MantleMareida Grossman-Orr, LCSW 02/20/2014, 4:00pm

## 2014-02-20 NOTE — Progress Notes (Signed)
D Terris is seen out on the hall first thing this monring . Her affect is sad, flat and blunted . She does not keep eye contact and she talks through other patients, saying " I just wanty to know when it's going to get better...ibuprofen can't stand it much longer"   A She takes her meds as scheduled and  She spends time sharing her feelings with staff. She adamantly refuses her Saphris, complaining it makes her sleep " all the time and I don't want anymore of that".   R She completes her self inventory and on it she writes she is poositive for sucidal ideation and she rates her depression and hopelessness "10 / 10" and writes her DC plan is " no idea, very suicidal". This nurse asks her if she can contract with this nurse, make a deal to stay safe....that she won't hurt herslef and she eagerly agrees with this Clinical research associatewriter that she can do this. POC maintained and continues.

## 2014-02-20 NOTE — Progress Notes (Signed)
Patient ID: Dollene Clevelanderri Hataway, female   DOB: February 28, 1966, 48 y.o.   MRN: [id number]  D: Patient withdrawn and secluded to her room. Pt pleasant with staff. No inappropriate behaviors noted. A: Q 15 minute safety checks, encourage staff/peer interaction and group participation. Administer medications as ordered. R: Pt compliant with medications. Pt denies SI or plans to harm herself.

## 2014-02-20 NOTE — ED Provider Notes (Signed)
Medical screening examination/treatment/procedure(s) were performed by non-physician practitioner and as supervising physician I was immediately available for consultation/collaboration.   EKG Interpretation None        Joya Gaskinsonald W Shade Kaley, MD 02/20/14 [phone]

## 2014-02-20 NOTE — [facility] Group Notes (Signed)
[facility] Group Notes:  (Nursing/MHT/Case Management/Adjunct)  Date:  02/20/2014  Time:  3:51 PM  Type of Therapy:  Psychoeducational Skills  Participation Level:  Active  Participation Quality:  Appropriate  Affect:  Appropriate  Cognitive:  Appropriate  Insight:  Appropriate  Engagement in Group:  Engaged  Modes of Intervention:  Discussion  Summary of Progress/Problems: Pt did attend self inventory group, pt reported that she was negative SI/HI, no AH/VH noted. Pt rated her depression as a 10, and her helplessness/hopelessness as a 10.     Pt reported concerns about not being on the appropriate medication for her bipolar, pt advised that the doctor will be made aware.   Jacquelyne BalintForrest, Aarianna Hoadley Shanta 02/20/2014, 3:51 PM

## 2014-02-20 NOTE — Progress Notes (Signed)
Patient ID: Stephanie Lowery, female   DOB: July 20, 1966, 48 y.o.   MRN: [id number] Patient just approached this writer and stated "I changed my mind, I will take the Vistaril now". Pt tearful and states she is "Having a lot of racing thoughts and anxiety". Pt states "Why is it my brother has a nice beach house and a great life and I'm in here? It's been over 40 years of the same thing and I'm still in the same situation and living with my Mom". Pt states she wants independence.

## 2014-02-20 NOTE — Progress Notes (Signed)
Patient ID: Stephanie Lowery, female   DOB: 10/16/1965, 48 y.o.   MRN: [id number]  D: Patient with c/o anxiety, but is refusing to take the Vistaril that was ordered stating "It doesn't work for me". Pt did, however, take her Buspar. Pt pleasant and cooperative with care and is interacting well tonight with peers on the unit. No inappropriate behaviors noted. A: Q 15 minute safety checks, encourage staff/peer interaction and group participation. Administer medications as ordered by MD. R: Patient with no s/s of distress noted. Pt denies SI currently and verbally contracts for safety.

## 2014-02-20 NOTE — [facility] Group Notes (Signed)
Adult Psychoeducational Group Note  Date:  02/20/2014 Time:  10:49 PM  Group Topic/Focus:  Wrap-Up Group:   The focus of this group is to help patients review their daily goal of treatment and discuss progress on daily workbooks.  Participation Level:  Minimal  Participation Quality:  Appropriate  Affect:  Flat  Cognitive:  Appropriate  Insight: Appropriate  Engagement in Group:  Limited  Modes of Intervention:  Discussion  Additional Comments:  Stephanie Lowery stated her day was bad until before dinner because the group playing a game, having fun.  She admits to being a negative person.  [name], [name] 02/20/2014, 10:49 PM

## 2014-02-21 MED ORDER — OLANZAPINE 2.5 MG PO TABS
ORAL_TABLET | ORAL | Status: AC
  Administered 2014-02-21: 13:00:00
  Filled 2014-02-21: qty 2

## 2014-02-21 MED ORDER — OLANZAPINE 5 MG PO TBDP
ORAL_TABLET | ORAL | Status: AC
  Filled 2014-02-21: qty 1

## 2014-02-21 MED ORDER — OLANZAPINE 5 MG PO TABS
5.0000 mg | ORAL_TABLET | Freq: Once | ORAL | Status: AC
  Administered 2014-02-21: 5 mg via ORAL
  Filled 2014-02-21: qty 1

## 2014-02-21 NOTE — Progress Notes (Signed)
Patient ID: Stephanie Lowery, female   DOB: [birth year], 48 y.o.   MRN: [id number]  D: Patient is interacting well with peers in milieu; no s/s of distress noted. Pt engaged in group session and is compliant with medications. A: Encourage staff/peer interaction, medication compliance, and group participation. Administer medications as ordered, maintain Q 15 minute safety checks. R: Pt states she is still extremely restless at night and may not be able to sleep again. Pt c/o racing thoughts at night. Will monitor. Pt denies SI or plans to harm herself.

## 2014-02-21 NOTE — Progress Notes (Addendum)
D Eternity is OOB UAL on the 500 hall today...she tolerates this poorly. She has endured periods of crying spells all day today...often seen breaking down sobbing and then begging for " something to make me feel better"...   A She is not able , again, to go to cafe' and eat her meals with the rest of the patients , choosing to stay back on the unit and drink Ensures , instead. She completes her morning assessment and on it she writes " will I be able to afford them" in response to question # 13, ie" do you know what your aftercare DC  Plans will be?"   R Safety is in place and poc includes continuing to address pt's insomnia, pt writes " i haven't had any sleep in   24 hrs. " In response to question #  10, ( " when you go home, what changes do  You plan to make to take better care of yourself ? " , pt writes " take meds, ECT's, one on one therapy"

## 2014-02-21 NOTE — [facility] Group Notes (Signed)
Adult Psychoeducational Group Note  Date:  02/21/2014 Time:  10:28 PM  Group Topic/Focus:  Wrap-Up Group:   The focus of this group is to help patients review their daily goal of treatment and discuss progress on daily workbooks.  Participation Level:  Active  Participation Quality:  Appropriate  Affect:  Appropriate  Cognitive:  Appropriate  Insight: Appropriate  Engagement in Group:  Engaged  Modes of Intervention:  Discussion, Education and Support  Additional Comments:  Pt rated her day at a 1.5. Pt said she had trouble sleeping last night and has been running on 2 hours of sleep. Pt said her day has gotten better by having visitors and positive connections she is having with other patients, as well as staff.  Malachy MoanJeffers, Latia Mataya S 02/21/2014, 10:28 PM

## 2014-02-21 NOTE — [facility] Group Notes (Signed)
[facility] Group Notes: (Clinical Social Work)   02/21/2014      Type of Therapy:  Group Therapy   Participation Level:  Did Not Attend - Was in the group room as group was starting, and left, did not return.   Ambrose MantleMareida Grossman-Orr, LCSW 02/21/2014, 4:12 PM

## 2014-02-21 NOTE — Progress Notes (Signed)
Patient ID: Stephanie Lowery, female   DOB: [birth year], 48 y.o.   MRN: [id number] [facility]  02/21/2014 12:29 PM Stephanie Clevelanderri Labarge  MRN:  [id number] Subjective:  Pt seen and chart viewed. Pt reports feeling "very manic". Pt denies depression, reports mild anxiety, but denies AVH, and contracts for safety. Pt reports not having slept in 27 hours. Pt is visibly manic with mildly pressured speech, and impulsivity as well as tangential conversation with need for redirection. Gave Zyprexa 5mg  PO x 1 dose; will continue to monitor.   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 She is now followed by Dr. Elenora GammaLunde at above mentioned hospital.Patient contacted me yesterday  By [facility], and reported that she was having suicidal ideations, with a thought of shooting herself, and wanted to find a gun at home ( family apparently owns weapons, but have hidden them from her). Because of this,I recommended she be admitted, and family brought her to ER- now admitted.Patient states she had been on [facility] list [facility], but a recent cholecistectomy for cholelithiasis and recovery from this procedure postponed /delayed ECT treatment so she has not started it yet. Patient stopped taking her medications  ( Tegretol and Prozac )  Several days ago " because they were not working for me". (+) neuro vegetative symptoms.   Diagnosis:   DSM5:  Depressive Disorders:  Major Depressive Disorder - Severe (296.23) Total Time spent with patient: 45 minutes  AXIS I:  Bipolar, mixed  Versus MDD , Severe, Recurrent. History of Panic Disorder AXIS II:  Cluster B Traits Axis III:  Past Medical History  Diagnosis Date  . Depression   . Anxiety    Axis IV: other psychosocial or environmental problems and problems related to social environment Axis V: 21-30  behavior considerably influenced by delusions or hallucinations OR serious impairment in judgment, communication OR inability to function in almost all areas  ADL's:  Intact  Sleep: Good  Appetite:  Good  Suicidal Ideation:  Denies Homicidal Ideation:  Denies AEB (as evidenced by):  Psychiatric Specialty Exam: Physical Exam  Review of Systems  Constitutional: Negative.   HENT: Negative.   Eyes: Negative.   Respiratory: Negative.   Cardiovascular: Negative.   Gastrointestinal: Negative.   Genitourinary: Negative.   Musculoskeletal: Negative.   Skin: Negative.   Neurological: Negative.   Endo/Heme/Allergies: Negative.   Psychiatric/Behavioral: Positive for depression. The patient is nervous/anxious.     Blood pressure 126/81, pulse 85, temperature 97.4 F (36.3 C), temperature source Oral, resp. rate 18, height 5\' 3"  (1.6 m), weight 65.772 kg (145 lb), last menstrual period 02/18/2014.Body mass index is 25.69 kg/(m^2).   General Appearance: Fairly Groomed  Patent attorneyye Contact::  Good  Speech:  Some pressured speech  Volume:  Normal  Mood:  Depressed and Irritable  Affect:  Constricted and Labile  Thought Process:  Linear  Orientation:  Full (Time, Place, and Person)  Thought Content:  no psychotic symptoms   Suicidal Thoughts:  No  Homicidal Thoughts:  No  Memory:  NA  Judgement:  Fair  Insight:  Fair  Psychomotor Activity:  Normal  Concentration:  Good  Recall:  Good  Fund of Knowledge:Good  Language: Good  Akathisia:  No  Handed:  Right  AIMS (if indicated):     Assets:  Desire for  Improvement Social Support  Sleep:  Number of Hours: 5.75    Musculoskeletal: Strength & Muscle Tone: within normal limits Gait & Station: normal Patient leans: N/A  Current Medications: Current Facility-Administered Medications  Medication Dose Route Frequency Provider Last Rate Last Dose  . acetaminophen (TYLENOL) tablet 650 mg  650 mg Oral Q4H PRN Kristeen MansFran E Hobson, NP   650 mg at  02/21/14 0302  . busPIRone (BUSPAR) tablet 5 mg  5 mg Oral BID Beau FannyJohn C Withrow, FNP   5 mg at 02/21/14 0856  . escitalopram (LEXAPRO) tablet 5 mg  5 mg Oral Daily Nehemiah MassedFernando Cobos, MD   5 mg at 02/21/14 0857  . hydrOXYzine (ATARAX/VISTARIL) tablet 50 mg  50 mg Oral QHS Beau FannyJohn C Withrow, FNP   50 mg at 02/20/14 2252  . ibuprofen (ADVIL,MOTRIN) tablet 600 mg  600 mg Oral Q8H PRN Kristeen MansFran E Hobson, NP      . magnesium hydroxide (MILK OF MAGNESIA) suspension 30 mL  30 mL Oral Daily PRN Kristeen MansFran E Hobson, NP      . OLANZapine (ZYPREXA) 2.5 MG tablet             Lab Results:  No results found for this or any previous visit (from the past 48 hour(s)).  Physical Findings: AIMS: Facial and Oral Movements Muscles of Facial Expression: None, normal Lips and Perioral Area: None, normal Jaw: None, normal Tongue: None, normal,Extremity Movements Upper (arms, wrists, hands, fingers): None, normal Lower (legs, knees, ankles, toes): None, normal, Trunk Movements Neck, shoulders, hips: None, normal, Overall Severity Severity of abnormal movements (highest score from questions above): None, normal Incapacitation due to abnormal movements: None, normal Patient's awareness of abnormal movements (rate only patient's report): No Awareness, Dental Status Current problems with teeth and/or dentures?: No Does patient usually wear dentures?: No  CIWA:    COWS:     Treatment Plan Summary: Daily contact with patient to assess and evaluate symptoms and progress in treatment Medication management  Plan: Review of chart, vital signs, medications, and notes.  1-Individual and group therapy  2-Medication management for depression and anxiety: Medications reviewed with the patient and she stated no untoward effects. -Discontinue Saphris (pt cites tremor which is visible to this NP, also cites feelings of severe malaise) -Start Buspar 5mg  bid for mood stabilization (pt reports great success with this  historically) -Discontinue Xanax -Zyprexa 5mg  PO once for mania  3-Coping skills for depression, anxiety  4-Continue crisis stabilization and management  5-Address health issues--monitoring vital signs, stable  6-Treatment plan in progress to prevent relapse of depression and anxiety  Medical Decision Making Problem Points:  Established problem, stable/improving (1), Review of last therapy session (1) and Review of psycho-social stressors (1) Data Points:  Review or order clinical lab tests (1) Review or order medicine tests (1) Review of medication regiment & side effects (2) Review of new medications or change in dosage (2)  I certify that inpatient services furnished can reasonably be expected to improve the patient's condition.   Beau FannyWithrow, John C, FNP-BC 02/21/2014, 12:29 PM  Patient seen, evaluated and I agree with notes by Nurse Practitioner. Thedore MinsMojeed Rose Hegner, MD

## 2014-02-22 MED ORDER — ARIPIPRAZOLE 2 MG PO TABS
2.0000 mg | ORAL_TABLET | Freq: Every day | ORAL | Status: DC
  Administered 2014-02-22 – 2014-02-24 (×3): 2 mg via ORAL
  Filled 2014-02-22: qty 28
  Filled 2014-02-22 (×5): qty 1

## 2014-02-22 MED ORDER — LITHIUM CARBONATE 150 MG PO CAPS
150.0000 mg | ORAL_CAPSULE | Freq: Two times a day (BID) | ORAL | Status: DC
  Administered 2014-02-23 – 2014-02-25 (×5): 150 mg via ORAL
  Filled 2014-02-22 (×5): qty 1
  Filled 2014-02-22 (×2): qty 28
  Filled 2014-02-22 (×4): qty 1

## 2014-02-22 NOTE — [facility] Group Notes (Signed)
[facility] LCSW Group Therapy          Overcoming Obstacles       1:15 -2:30        02/22/2014       Type of Therapy:  Group Therapy  Participation Level:  Appropriate  Participation Quality:  Appropriate  Affect:  Appropriate, Alert  Cognitive:  Attentive Appropriate  Insight: Developing/Improving Engaged  Engagement in Therapy: Developing/Imprvoing Engaged  Modes of Intervention:  Discussion Exploration  Education Rapport BuildingProblem-Solving Support  Summary of Progress/Problems:  The main focus of today's group was overcoming obstacles.  She advised of concerns of weight gain as an obstacle she has to overcome.  Patient shared she often plays around with how much of the medications she wants to take.  CSW acknowledged weight gain as important but challenged patient to consider her life more important than gaining weight.  Patient able to identify appropriate coping skills.   Wynn BankerHodnett, Stephanie Lowery 02/22/2014

## 2014-02-22 NOTE — Progress Notes (Signed)
Adult Psychoeducational Group Note  Date:  02/22/2014 Time:  10:14 PM  Group Topic/Focus:  Goals Group:   The focus of this group is to help patients establish daily goals to achieve during treatment and discuss how the patient can incorporate goal setting into their daily lives to aide in recovery.  Participation Level:  Active  Participation Quality:  Appropriate  Affect:  Appropriate  Cognitive:  Appropriate  Insight: Appropriate  Engagement in Group:  Engaged  Modes of Intervention:  Discussion  Additional Comments:  Pt stated that she is feeling better about herself and her life is getting back on a positive note.  Terie PurserParker, Corrine Tillis R 02/22/2014, 10:14 PM

## 2014-02-22 NOTE — Progress Notes (Signed)
D: Patient in the dayroom sitting quietly on approach.  Patient states she had a good day.  Patient states she is worried because she states she did not sleep last night.  Patient states she has to sleep in order to get better.  Patient has to be reminded to worry about herself at times.  Patient  Has one moment tonight where she was intrusive but easily redirected. Patient denies SI/HI and denies AVH. A: Staff to monitor Q 15 mins for safety.  Encouragement and support offered.  Scheduled medications administered per orders. R: Patient remains safe on the unit.  Patient attended group tonight.  Patient visible on the unit and interacting with peers.  Patient taking administered medications.

## 2014-02-22 NOTE — Progress Notes (Signed)
D:  Patient's self inventory sheet, patient has fair sleep, improving appetite, normal energy level, improving attention span.  Rated depression and hopeless 4, anxiety 8.  Has experienced diarrhea in past 24 hours.  Denied SI.  Has experienced pain in past 24 hours.  Worst pain #6.  Plans to take meds, be more active after discharge.  Has experienced headache in past 24 hours.  Wants to be discharged. A:  Medications administered per MD orders.  Emotional support and encouragement given patient. R:  Denied SI and HI.  Denied A/V hallucinations.  Contracts for safety.  Will continue to monitor patient for safety with 15 minute checks.  Safety maintained.

## 2014-02-22 NOTE — Progress Notes (Addendum)
Patient ID: Stephanie Lowery, female   DOB: 04/20/1966, 48 y.o.   MRN: [id number] [facility]  02/22/2014 5:57 PM Stephanie Lowery  MRN:  [id number] Subjective: She feels better, although still labile, describes her affect as a roller coaster, with several variations during the day.   Objective: Patient seems significantly improved compared to her initial presentation. As noted she does continue to report emotional lability, and continues to report passive thoughts of being better off dead, although denies any actual SI and is able to contract for safety. However, she is presenting with improved relatedness, a fuller range of affect, and somewhat more hopeful that she is going to  Improve. She is tolerating medications well- she states she realizes she has tended to make a number of medication changes on her own, probably prematurely, due to frustration that she is not improving more quickly, but now wants to follow a medication regimen as recommended. Going to groups , no disruptive behaviors on unit.     Depressive Disorders:  Major Depressive Disorder - Severe (296.23) Total Time spent with patient: 20 minutes   AXIS I:  Bipolar, mixed  Versus MDD , Severe, Recurrent. History of Panic Disorder AXIS II:  Cluster B Traits Axis III:  Past Medical History  Diagnosis Date  . Depression   . Anxiety    Axis IV: other psychosocial or environmental problems and problems related to social environment Axis V: 40 currently   ADL's:  Intact  Sleep: Good  Appetite:  Good  Suicidal Ideation:  Denies Homicidal Ideation:  Denies AEB (as evidenced by):  Psychiatric Specialty Exam: Physical Exam  Review of Systems  Constitutional: Negative.  Negative for fever and chills.  HENT: Negative.   Eyes: Negative.   Respiratory: Negative.  Negative for shortness of breath.   Cardiovascular: Negative.  Negative for chest pain.  Gastrointestinal: Negative.  Negative for nausea and vomiting.   Genitourinary: Negative.   Musculoskeletal: Negative.   Skin: Negative.   Neurological: Negative.   Endo/Heme/Allergies: Negative.   Psychiatric/Behavioral: Positive for depression and suicidal ideas. The patient is nervous/anxious.     Blood pressure 121/87, pulse 87, temperature 98.2 F (36.8 C), temperature source Oral, resp. rate 18, height 5\' 3"  (1.6 m), weight 65.772 kg (145 lb), last menstrual period 02/18/2014.Body mass index is 25.69 kg/(m^2).   General Appearance: improved   Eye Contact::  Good, better related and more engaged currently  Speech:  Normal , not pressured currently  Volume:  Normal  Mood:  Improved mood, and improved range of affect   Affect:  Improved   Thought Process:  Linear  Orientation:  Full (Time, Place, and Person)  Thought Content:  no psychotic symptoms   Suicidal Thoughts:  At this time denies any plan or intention of hurting self and contracts for safety on unit   Homicidal Thoughts:  No  Memory:  NA  Judgement:  Fair  Insight:  Fair  Psychomotor Activity:  Normal  Concentration:  Good  Recall:  Good  Fund of Knowledge:Good  Language: Good  Akathisia:  No  Handed:  Right  AIMS (if indicated):     Assets:  Desire for Improvement Social Support  Sleep:  Number of Hours: 5.75    Musculoskeletal: Strength & Muscle Tone: within normal limits Gait & Station: normal Patient leans: N/A  Current Medications: Current Facility-Administered Medications  Medication Dose Route Frequency Provider Last Rate Last Dose  . acetaminophen (TYLENOL) tablet 650 mg  650 mg Oral Q4H  PRN Kristeen MansFran E Hobson, NP   650 mg at 02/21/14 1840  . busPIRone (BUSPAR) tablet 5 mg  5 mg Oral BID Beau FannyJohn C Withrow, FNP   5 mg at 02/22/14 1715  . escitalopram (LEXAPRO) tablet 5 mg  5 mg Oral Daily Nehemiah MassedFernando Shakea Isip, MD   5 mg at 02/22/14 16100814  . hydrOXYzine (ATARAX/VISTARIL) tablet 50 mg  50 mg Oral QHS Beau FannyJohn C Withrow, FNP   50 mg at 02/21/14 2102  . ibuprofen (ADVIL,MOTRIN)  tablet 600 mg  600 mg Oral Q8H PRN Kristeen MansFran E Hobson, NP   600 mg at 02/22/14 0820  . magnesium hydroxide (MILK OF MAGNESIA) suspension 30 mL  30 mL Oral Daily PRN Kristeen MansFran E Hobson, NP        Lab Results:  No results found for this or any previous visit (from the past 48 hour(s)).  Physical Findings: AIMS: Facial and Oral Movements Muscles of Facial Expression: None, normal Lips and Perioral Area: None, normal Jaw: None, normal Tongue: None, normal,Extremity Movements Upper (arms, wrists, hands, fingers): None, normal Lower (legs, knees, ankles, toes): None, normal, Trunk Movements Neck, shoulders, hips: None, normal, Overall Severity Severity of abnormal movements (highest score from questions above): None, normal Incapacitation due to abnormal movements: None, normal Patient's awareness of abnormal movements (rate only patient's report): No Awareness, Dental Status Current problems with teeth and/or dentures?: No Does patient usually wear dentures?: No  CIWA:    COWS:  Assessment- [patient improving. Had recently been started on Saphris but it was stopped due to feeling jittery. As noted in prior notes, patient has a history of good/best response to Abilify in the past, with which she did much better clinically, but developed some abnormal oro buccal movements on it. We discussed and she agrees to restart at a low dose, in the hopes it does not cause side effects and does help her mood/affect. Also, she has been on Lithium in the past and it seems to have been well tolerated and helpful.     Treatment Plan Summary: Daily contact with patient to assess and evaluate symptoms and progress in treatment Medication management  Plan: Start Lithium at 300 mgrs BID, and start Abilify at 2 mgrs QHS. Will follow   Continue inpatient care and provide milieu, supportive group therapies .   Medical Decision Making Problem Points:  Established problem, stable/improving (1) Data Points:  Review of  medication regiment & side effects (2) Review of new medications or change in dosage (2)  I certify that inpatient services furnished can reasonably be expected to improve the patient's condition.   Nehemiah MassedCOBOS, Loralei Radcliffe, FNP-BC 02/22/2014, 5:57 PM

## 2014-02-22 NOTE — Progress Notes (Signed)
Patient ID: Stephanie Lowery, female   DOB: 10-May-1966, 48 y.o.   MRN: [id number] PER STATE REGULATIONS 482.30  THIS CHART WAS REVIEWED FOR MEDICAL NECESSITY WITH RESPECT TO THE PATIENT'S ADMISSION/ DURATION OF STAY.  NEXT REVIEW DATE: 02/26/2014  Willa RoughJENNIFER JONES Nicloe Frontera, RN, BSN CASE MANAGER

## 2014-02-22 NOTE — [facility] Group Notes (Signed)
[facility]   02/22/2014 11:29 AM    Participation Quality:  Appropraite  Mood/Affect:  Appropriate  Depression Rating:  9  Anxiety Rating:  9  Thoughts of Suicide:  Yes  Will you contract for safety?   Yes   Current AVH:  No  Plan for Discharge/Comments:  Patient attended discharge planning group and actively participated in group.  She advised of not sleeping well and wanting to discharge to the hospital in [place] to get medication needed for her migraine.  Follow up scheduled with provider in [place]. CSW provided all participants with daily workbook.   Transportation Means: Patient has transportation.   Supports:  Patient has a support system.   Haeley Fordham, Joesph JulyQuylle Hairston

## 2014-02-22 NOTE — Progress Notes (Signed)
Pt attended spiritual care group on grief and loss facilitated by chaplain Ellanor Feuerstein   Group opened with brief discussion and psycho-social ed around grief and loss in relationships and in relation to self - identifying life patterns, circumstances, changes that cause losses. Established group norm of speaking from own life experience. Group goal of establishing open and affirming space for members to share loss and experience with grief, normalize grief experience and provide psycho social education and grief support.     

## 2014-02-23 NOTE — Progress Notes (Signed)
D:  Patient's self inventory sheet, patient has fair sleep, poor appetite, OK energy level, improving attention span.  Rated depression, denied hopeless.  Has experienced diarrhea in past 24 hours.  SI off/on, contracts for safety.  Has experienced pain in past 24 hours, very light headaches.  Pain goal 0-1, worst pain 1.  Plans to stay on meds, more active, play with cat.  Great staff.  Excellent Dr. Jama Flavorsobos, Renata Capriceonrad.  Almost ready to go home, goal Wednesday.  No discharge plans.  Needs financial assistance with medications after discharge, meds are expensive.   A:  Medications administered per MD orders.  Emotional support and encouragement given patient. R:  Denied HI.  Denied A/V hallucinations.  SI, contracts for safety.  Will continue to monitor patient for safety with 15 minute checks.  Safety maintained.

## 2014-02-23 NOTE — Tx Team (Addendum)
Interdisciplinary Treatment Plan Update   Date Reviewed:  02/23/2014  Time Reviewed:  8:33 AM  Progress in Treatment:   Attending groups: Yes Participating in groups: Yes Taking medication as prescribed: Yes  Tolerating medication: Yes Family/Significant other contact made:  Yes, collateral contact with mother Patient understands diagnosis: Yes  Discussing patient identified problems/goals with staff: Yes Medical problems stabilized or resolved: Yes Denies suicidal/homicidal ideation: Yes Patient has not harmed self or others: Yes  For review of initial/current patient goals, please see plan of care.  Estimated Length of Stay:  2-4 days  Reasons for Continued Hospitalization:  Anxiety Depression Medication stabilization Suicidal ideation  New Problems/Goals identified:    Discharge Plan or Barriers:   Home with outpatient follow up [place] [facility] [place]  Additional Comments:  Attendees:  Patient:  02/23/2014 8:33 AM   Signature:  [name], MD 02/23/2014 8:33 AM  Signature:  02/23/2014 8:33 AM  Signature:   02/23/2014 8:33 AM  Signature:Beverly Terrilee CroakKnight, RN 02/23/2014 8:33 AM  Signature:  Neill Loftarol Davis RN 02/23/2014 8:33 AM  Signature:  Juline PatchQuylle Moselle Rister, LCSW 02/23/2014 8:33 AM  Signature:  [name], LCSW 02/23/2014 8:33 AM  Signature:  [name], Care Coordinator [facility] 02/23/2014 8:33 AM  Signature: 02/23/2014 8:33 AM  Signature: Leighton ParodyBritney Tyson, RN 02/23/2014  8:33 AM  Signature:   [name], RN [facility] 02/23/2014  8:33 AM  Signature: 02/23/2014  8:33 AM    Scribe for Treatment Team:   Juline PatchQuylle Trachelle Low,  02/23/2014 8:33 AM

## 2014-02-23 NOTE — Progress Notes (Signed)
The focus of this group is to educate the patient on the purpose and policies of crisis stabilization and provide a format to answer questions about their admission.  The group details unit policies and expectations of patients while admitted.  Patient attended 0900 nurse education orientation group this morning.  Patient actively participated, appropriate affect, alert, appropriate insight and engagement.  Today patient will work on 3 goals for discharge.  

## 2014-02-23 NOTE — Progress Notes (Signed)
Patient ID: Stephanie Lowery, female   DOB: 1966-01-21, 48 y.o.   MRN: [id number] [facility]  02/23/2014 4:14 PM Stephanie Clevelanderri Pickard  MRN:  [id number] Subjective:  States she is better. She states she has noticed she feels less irritable, and also notes she has been less focused and concerned about her weight, which has been a persistent rumination in the past.   Objective:  I have discussed case with treatment team/ have seen patient. She is improving, and presents with a significantly improved mood and affect. Not irritable and not as labile today.  She is going to groups , and behavior on unit is in good control. She denies medication side effects- as noted, in the past Abilify ( which has been very helpful clinically during previous trial) caused some abnormal orobuccal movements but at this time she is not presenting with any abnormal involuntary movements. She is starting to look forward to discharge, and plans to return to her parents in [place], [place].  Responds well to support, encouragement, review of adaptive coping skills and resilience.    Depressive Disorders:  Major Depressive Disorder - Severe (296.23) Total Time spent with patient: 20 minutes   AXIS I:  Bipolar, mixed  Versus MDD , Severe, Recurrent. History of Panic Disorder AXIS II:  Cluster B Traits Axis III:  Past Medical History  Diagnosis Date  . Depression   . Anxiety    Axis IV: other psychosocial or environmental problems and problems related to social environment Axis V: 40 currently   ADL's:  Intact  Sleep: Good  Appetite:  Good  Suicidal Ideation:  Denies Homicidal Ideation:  Denies AEB (as evidenced by):  Psychiatric Specialty Exam: Physical Exam  Review of Systems  Constitutional: Negative.  Negative for fever and chills.  HENT: Negative.   Eyes: Negative.   Respiratory: Negative.  Negative for shortness of breath.   Cardiovascular: Negative.  Negative for chest pain.  Gastrointestinal:  Negative.  Negative for nausea and vomiting.  Genitourinary: Negative.   Musculoskeletal: Negative.   Skin: Negative.   Neurological: Negative.   Endo/Heme/Allergies: Negative.   Psychiatric/Behavioral: Positive for depression. Negative for suicidal ideas. The patient is not nervous/anxious.     Blood pressure 125/85, pulse 79, temperature 98.7 F (37.1 C), temperature source Oral, resp. rate 18, height 5\' 3"  (1.6 m), weight 65.772 kg (145 lb), last menstrual period 02/18/2014.Body mass index is 25.69 kg/(m^2).   General Appearance: improved   Eye Contact::  Good, well related   Speech:  Normal , not pressured currently  Volume:  Normal  Mood:  Improving   Affect: more reactive, less irritable   Thought Process:  Linear  Orientation:  Full (Time, Place, and Person)  Thought Content: no hallucinations and no delusions  Suicidal Thoughts:  Denies any SI, contracts for safety on the unit  Homicidal Thoughts:  No  Memory:  NA  Judgement:  Fair  Insight:  Fair  Psychomotor Activity:  Normal  Concentration:  Good  Recall:  Good  Fund of Knowledge:Good  Language: Good  Akathisia:  No  Handed:  Right  AIMS (if indicated):     Assets:  Desire for Improvement Social Support  Sleep:  Number of Hours: 5.75    Musculoskeletal: Strength & Muscle Tone: within normal limits Gait & Station: normal Patient leans: N/A  Current Medications: Current Facility-Administered Medications  Medication Dose Route Frequency Provider Last Rate Last Dose  . acetaminophen (TYLENOL) tablet 650 mg  650 mg Oral Q4H  PRN Kristeen MansFran E Hobson, NP   650 mg at 02/23/14 1309  . ARIPiprazole (ABILIFY) tablet 2 mg  2 mg Oral QHS Nehemiah MassedFernando Cobos, MD   2 mg at 02/22/14 2118  . busPIRone (BUSPAR) tablet 5 mg  5 mg Oral BID Beau FannyJohn C Withrow, FNP   5 mg at 02/23/14 [id number]  . escitalopram (LEXAPRO) tablet 5 mg  5 mg Oral Daily Nehemiah MassedFernando Cobos, MD   5 mg at 02/23/14 [id number]  . hydrOXYzine (ATARAX/VISTARIL) tablet 50 mg  50 mg Oral  QHS Beau FannyJohn C Withrow, FNP   50 mg at 02/22/14 2118  . ibuprofen (ADVIL,MOTRIN) tablet 600 mg  600 mg Oral Q8H PRN Kristeen MansFran E Hobson, NP   600 mg at 02/22/14 0820  . lithium carbonate capsule 150 mg  150 mg Oral BID WC Nehemiah MassedFernando Cobos, MD   150 mg at 02/23/14 0819  . magnesium hydroxide (MILK OF MAGNESIA) suspension 30 mL  30 mL Oral Daily PRN Kristeen MansFran E Hobson, NP        Lab Results:  No results found for this or any previous visit (from the past 48 hour(s)).  Physical Findings: AIMS: Facial and Oral Movements Muscles of Facial Expression: None, normal Lips and Perioral Area: None, normal Jaw: None, normal Tongue: None, normal,Extremity Movements Upper (arms, wrists, hands, fingers): None, normal Lower (legs, knees, ankles, toes): None, normal, Trunk Movements Neck, shoulders, hips: None, normal, Overall Severity Severity of abnormal movements (highest score from questions above): None, normal Incapacitation due to abnormal movements: None, normal Patient's awareness of abnormal movements (rate only patient's report): No Awareness, Dental Status Current problems with teeth and/or dentures?: No Does patient usually wear dentures?: No  CIWA:  CIWA-Ar Total: 1 COWS:  Assessment- Ahava continues to improve, with an overall improved mood, decreased emotional lability, decreased irritability, decreased negative ruminations. So far tolerating medication regimen well, to include low dose Abilify and LiC03 . Behavior on unit in good control.   Treatment Plan Summary: Continue inpatient treatment/ milieu, group therapies, continue medication regimen- no medication changes today.   Plan: Start Lithium at 300 mgrs BID, and start Abilify at 2 mgrs QHS. Will follow     Medical Decision Making Problem Points:  Established problem, stable/improving (1) Data Points:  Review of medication regiment & side effects (2)  I certify that inpatient services furnished can reasonably be expected to improve the  patient's condition.   Nehemiah MassedCOBOS, FERNANDO, FNP-BC 02/23/2014, 4:14 PM

## 2014-02-23 NOTE — [facility] Group Notes (Signed)
  [facility] LCSW Group Therapy      Feelings About Diagnosis 1:15 - 2:30 PM         02/23/2014    Type of Therapy:  Group Therapy  Participation Level:  Active  Participation Quality:  Appropriate  Affect:  Appropriate  Cognitive:  Alert and Appropriate  Insight:  Developing/Improving and Engaged  Engagement in Therapy:  Developing/Improving and Engaged  Modes of Intervention:  Discussion, Education, Exploration, Problem-Solving, Rapport Building, Support  Summary of Progress/Problems:  Patient actively participated in group. Patient discussed past and present diagnosis and the effects it has had on  life.  Patient talked about family and society being judgmental and the stigma associated with having a mental health diagnosis.  Patient stated she initially felt labeled but now accepts her illness.   Wynn BankerHodnett, Quylle Hairston 02/23/2014

## 2014-02-23 NOTE — Progress Notes (Signed)
Adult Psychoeducational Group Note  Date:  02/23/2014 Time:  10:43 PM  Group Topic/Focus:  Wrap-Up Group:   The focus of this group is to help patients review their daily goal of treatment and discuss progress on daily workbooks.  Participation Level:  Minimal  Participation Quality:  Drowsy  Affect:  Flat  Cognitive:  Confused  Insight: Lacking  Engagement in Group:  Resistant  Modes of Intervention:  Activity  Additional Comments:  PT was in group but was not active at all   Pettress, Delton R 02/23/2014, 10:43 PM

## 2014-02-23 NOTE — Progress Notes (Signed)
D: Patient in the hallway on approach.  Patient states she had a good day.  Patient states she feels like things are turning around for her.  Patient states she still wants to make sure her medications are starting to work before she is discharged.  Patient denies SI/HI and denies AVH. A: Staff to monitor Q 15 mins for safety.  Encouragement and support offered.  Scheduled medications administered per orders. R: Patient remains safe on the unit.  Patient attended group tonight.  Patient visible on the unit and interacting with peers.  Patient taking administered medications.

## 2014-02-24 NOTE — [facility] Group Notes (Signed)
[facility]   02/24/2014 8:45 AM  Participation Quality:  Alert, Appropriate and Oriented  Mood/Affect:  Calm  Depression Rating:  2  Anxiety Rating:  2  Thoughts of Suicide:  Pt denies SI/HI  Will you contract for safety?   Yes  Current AVH:  Pt denies  Plan for Discharge/Comments:  Pt attended discharge planning group and actively participated in group.  CSW provided pt with today's workbook.  Pt reports feeling well today.  Pt plans to return home in [place] and has follow up scheduled with Behavioral Services for outpatient medication management and therapy. No further needs voiced by pt at this time.    Transportation Means: Pt reports access to transportation - mom will pick pt up  Supports: No supports mentioned at this time  Stephanie IvanChelsea Horton, LCSW 02/24/2014 10:04 AM

## 2014-02-24 NOTE — [facility] Group Notes (Signed)
[facility] LCSW Group Therapy  02/24/2014  1:15 PM   Type of Therapy:  Group Therapy  Participation Level:  Active  Participation Quality:  Attentive, Sharing and Supportive  Affect:  Calm  Cognitive:  Alert and Oriented  Insight:  Developing/Improving and Engaged  Engagement in Therapy:  Developing/Improving and Engaged  Modes of Intervention:  Clarification, Confrontation, Discussion, Education, Exploration, Limit-setting, Orientation, Problem-solving, Rapport Building, Dance movement psychotherapisteality Testing, Socialization and Support  Summary of Progress/Problems: The topic for group today was emotional regulation.  This group focused on both positive and negative emotion identification and allowed group members to process ways to identify feelings, regulate negative emotions, and find healthy ways to manage internal/external emotions. Group members were asked to reflect on a time when their reaction to an emotion led to a negative outcome and explored how alternative responses using emotion regulation would have benefited them. Group members were also asked to discuss a time when emotion regulation was utilized when a negative emotion was experienced.  Pt shared that she was suicidal and told her mom not to call 911 but her mom did anyways, which at the time made her angry.  Pt states that she understands why her mom did call and her concern for pt's safety at that time.  Pt states that she plans to forgive her mom and have a discussion about safety planning, if this were to occur again.  Pt actively participated and was engaged in group discussion.    Shadoe Cryan Horton, LCSW 02/24/2014  3:00 PM

## 2014-02-24 NOTE — Progress Notes (Signed)
Patient ID: Stephanie Clevelanderri Ovens, female   DOB: 09-16-1965, 48 y.o.   MRN: [id number] [facility]  02/24/2014 5:15 PM Stephanie Lowery  MRN:  [id number] Subjective:  Reports improvement and at this time denies any side effects on medicationst.   Objective: Patient reports she is feeling " much better" and is tolerating medications well thus far. She had been concerned about developing abnormal involuntary movements on Abilify but has had none thus far. She is presenting with a much improved mood and affect- currently euthymic. No psychotic symptoms. No suicidal ideations or ruminations. She is going to groups , and behavior on unit is in good control. No medication side effects at this time . We discussed discharge issues- patient plans to return to her parents' home in [place] , [place] ( they will pick her up when discharged). She plans to follow up [facility] [place] ( Dr. Elenora GammaLunde)     Depressive Disorders:  Major Depressive Disorder - Severe (296.23) Total Time spent with patient: 20 minutes   AXIS I:  Bipolar, mixed  Versus MDD , Severe, Recurrent. History of Panic Disorder AXIS II:  Cluster B Traits Axis III:  Past Medical History  Diagnosis Date  . Depression   . Anxiety    Axis IV: other psychosocial or environmental problems and problems related to social environment Axis V: 40 currently   ADL's:  Intact  Sleep: Good  Appetite:  Good  Suicidal Ideation:  Denies Homicidal Ideation:  Denies AEB (as evidenced by):  Psychiatric Specialty Exam: Physical Exam  Review of Systems  Constitutional: Negative.  Negative for fever and chills.  HENT: Negative.   Eyes: Negative.   Respiratory: Negative.  Negative for shortness of breath.   Cardiovascular: Negative.  Negative for chest pain.  Gastrointestinal: Negative.  Negative for nausea and vomiting.  Genitourinary: Negative.   Musculoskeletal: Negative.   Skin: Negative.    Neurological: Negative.   Endo/Heme/Allergies: Negative.   Psychiatric/Behavioral: Positive for depression. Negative for suicidal ideas. The patient is not nervous/anxious.        Depression much improved currently    Blood pressure 130/84, pulse 89, temperature 98.6 F (37 C), temperature source Oral, resp. rate 20, height 5\' 3"  (1.6 m), weight 65.772 kg (145 lb), last menstrual period 02/18/2014.Body mass index is 25.69 kg/(m^2).   General Appearance: improved   Eye Contact::  Good, well related   Speech:  Normal , not pressured currently  Volume:  Normal  Mood:  Euthymic today   Affect: pleasant , full in range today  Thought Process:  Linear  Orientation:  Full (Time, Place, and Person)  Thought Content: no hallucinations and no delusions  Suicidal Thoughts:  Denies any SI, contracts for safety on the unit  Homicidal Thoughts:  No  Memory:  NA  Judgement:  Fair  Insight:  Fair  Psychomotor Activity:  Normal  Concentration:  Good  Recall:  Good  Fund of Knowledge:Good  Language: Good  Akathisia:  No  Handed:  Right  AIMS (if indicated):     Assets:  Desire for Improvement Social Support  Sleep:  Number of Hours: 5.75    Musculoskeletal: Strength & Muscle Tone: within normal limits Gait & Station: normal Patient leans: N/A  Current Medications: Current Facility-Administered Medications  Medication Dose Route Frequency Priyansh Pry Last Rate Last Dose  . acetaminophen (TYLENOL) tablet 650 mg  650 mg Oral Q4H PRN Kristeen MansFran E Hobson, NP   650 mg  at 02/23/14 1309  . ARIPiprazole (ABILIFY) tablet 2 mg  2 mg Oral QHS Nehemiah MassedFernando Cobos, MD   2 mg at 02/23/14 2122  . busPIRone (BUSPAR) tablet 5 mg  5 mg Oral BID Beau FannyJohn C Withrow, FNP   5 mg at 02/24/14 1709  . escitalopram (LEXAPRO) tablet 5 mg  5 mg Oral Daily Nehemiah MassedFernando Cobos, MD   5 mg at 02/24/14 [id number]  . hydrOXYzine (ATARAX/VISTARIL) tablet 50 mg  50 mg Oral QHS Beau FannyJohn C Withrow, FNP   50 mg at 02/23/14 2122  . ibuprofen (ADVIL,MOTRIN)  tablet 600 mg  600 mg Oral Q8H PRN Kristeen MansFran E Hobson, NP   600 mg at 02/22/14 0820  . lithium carbonate capsule 150 mg  150 mg Oral BID WC Nehemiah MassedFernando Cobos, MD   150 mg at 02/24/14 1709  . magnesium hydroxide (MILK OF MAGNESIA) suspension 30 mL  30 mL Oral Daily PRN Kristeen MansFran E Hobson, NP        Lab Results:  No results found for this or any previous visit (from the past 48 hour(s)).  Physical Findings: AIMS: Facial and Oral Movements Muscles of Facial Expression: None, normal Lips and Perioral Area: None, normal Jaw: None, normal Tongue: None, normal,Extremity Movements Upper (arms, wrists, hands, fingers): None, normal Lower (legs, knees, ankles, toes): None, normal, Trunk Movements Neck, shoulders, hips: None, normal, Overall Severity Severity of abnormal movements (highest score from questions above): None, normal Incapacitation due to abnormal movements: None, normal Patient's awareness of abnormal movements (rate only patient's report): No Awareness, Dental Status Current problems with teeth and/or dentures?: No Does patient usually wear dentures?: No  CIWA:  CIWA-Ar Total: 1 COWS:  Assessment- Patient is presenting with significant improvement today- euthymic, with a fuller range of affect and resolution of frequent suicidal ruminations. She is currently tolerating medication regimen well .   Treatment Plan Summary: Continue inpatient treatment/ milieu, group therapies, continue medication regimen- no medication changes today.   Plan:  Continue current medication regimen- keep low dose of Abilify, Lic03, and Lexapro, as patient has been prone to stopping medication trials  due to to side effects in the past and prefers to start at lower doses. Seems to be responding well to current regimen/doses. Discharge tomorrow if she continues to stabilize.     Medical Decision Making Problem Points:  Established problem, stable/improving (1) Data Points:  Review of medication regiment & side  effects (2)  I certify that inpatient services furnished can reasonably be expected to improve the patient's condition.   Nehemiah MassedCOBOS, FERNANDO, FNP-BC 02/24/2014, 5:15 PM

## 2014-02-24 NOTE — Progress Notes (Signed)
Adult Psychoeducational Group Note  Date:  02/24/2014 Time:  6:20 PM  Group Topic/Focus:  Personal Choices and Values:   The focus of this group is to help patients assess and explore the importance of values in their lives, how their values affect their decisions, how they express their values and what opposes their expression.  Participation Level:  Active  Participation Quality:  Appropriate and Attentive  Affect:  Appropriate  Cognitive:  Alert and Appropriate  Insight: Good  Engagement in Group:  Improving  Modes of Intervention:  Discussion, Education and Support  Additional Comments:  Pt identified having a purpose, companionship and learning how to deal with people as important values in her life.  [name], [name] 02/24/2014, 6:20 PM

## 2014-02-24 NOTE — Progress Notes (Signed)
D: Pt reports some anxiety and hopelessness. She complains of poor appetite and low energy.  She is requesting Ensure- stating that she is having a difficult time eating.  A: Support/encouragement given. MD notified of pt's request. R: Pt. Remains safe. Contracts for safety.

## 2014-02-24 NOTE — Progress Notes (Signed)
D: Patient in the dayroom on approach.  Patient states she had a good day.  Patient states she is prepared for discharge tomorrow.  Patient states she has learned that there is a place for her to go if she has a crisis.  Patient denies SI/HI and denies AVH. A: Staff to monitor Q 15 mins for safety.  Encouragement and support offered.  Scheduled medications administered per orders. R: Patient remains safe on the unit.  Patient attended group tonight.  Patient visible on the unit and interacting with peers.  Patient taking administered medications.

## 2014-02-24 NOTE — Progress Notes (Signed)
Adult Psychoeducational Group Note  Date:  02/24/2014 Time:  9:42 PM  Group Topic/Focus:  Wrap-Up Group:   The focus of this group is to help patients review their daily goal of treatment and discuss progress on daily workbooks.  Participation Level:  Active  Participation Quality:  Appropriate  Affect:  Appropriate  Cognitive:  Appropriate  Insight: Appropriate  Engagement in Group:  Engaged  Modes of Intervention:  Support  Additional Comments:  Pt stated that he is glad that she was able to come to a place where she can get help and that she has someone that can help her and that this support system has really helped her.   Florette Thai 02/24/2014, 9:42 PM

## 2014-02-24 NOTE — Progress Notes (Signed)
Chaplain consulted with pt at pt request / nursing referral.    Stephanie Lowery is hopeful in discharging tomorrow.  Spoke with chaplain about her faith and framed this as resource for her as she discharges.  Recounted cycle of coming into hospital and spoke about his as not  Spoke with chaplain about faiths of two of her friends (Buddhism and Islam). Stephanie Lowery requested prayers for continued hope as she discharges.   Stephanie Lowery, Matthew Wayne MDiv

## 2014-02-24 NOTE — Progress Notes (Signed)
Adult Activity Group Note  Date: 02/24/14 Time: 10am  Group Topic: Art  Participation Level: Minimal  Participation Quality: Attentive  Affect:  Appropriate  Activity: Patients asked to create art to coincide with daily unit theme using any combination of markers, crayons, color pencils, construction paper, magazine clippings, glue, and scissors.   Additional Comments: Pts explored summertime and the activities they would like to explore when they leave the hospital. Pts created a list on poster board.

## 2014-02-25 DIAGNOSIS — F410 Panic disorder [episodic paroxysmal anxiety] without agoraphobia: Secondary | ICD-10-CM

## 2014-02-25 MED ORDER — HYDROXYZINE HCL 50 MG PO TABS: 50.0000 mg | ORAL_TABLET | Freq: Every day | ORAL | Status: DC

## 2014-02-25 MED ORDER — BUSPIRONE HCL 5 MG PO TABS: 5.0000 mg | ORAL_TABLET | Freq: Two times a day (BID) | ORAL | Status: DC

## 2014-02-25 MED ORDER — ESCITALOPRAM OXALATE 5 MG PO TABS: 5.0000 mg | ORAL_TABLET | Freq: Every day | ORAL | Status: DC

## 2014-02-25 MED ORDER — ARIPIPRAZOLE 2 MG PO TABS: 2.0000 mg | ORAL_TABLET | Freq: Every day | ORAL | Status: DC

## 2014-02-25 MED ORDER — NORETHINDRONE ACET-ETHINYL EST 1-20 MG-MCG PO TABS: 1.0000 | ORAL_TABLET | Freq: Every day | ORAL | Status: DC

## 2014-02-25 MED ORDER — LITHIUM CARBONATE 150 MG PO CAPS: 150.0000 mg | ORAL_CAPSULE | Freq: Two times a day (BID) | ORAL | Status: DC

## 2014-02-25 NOTE — Progress Notes (Signed)
[facility] :  Will you be returning to the same living situation after discharge: Yes,  home At discharge, do you have transportation home?:Yes,  with family Do you have the ability to pay for your medications:Yes,  no barriers  Release of information consent forms completed and in the chart;  Patient's signature needed at discharge.  Patient to Follow up at: Follow-up Information   [facility] On 03/08/2014. (Monday, March 08, 2014 at 3:45 PM)    Contact information:   [address] [place], [place]   [id number]  [phone]      Follow up with Marjorie SmolderElizabeth Manley - [facility] On 03/22/2014. (Monday, March 22, 2014 at 4:00 PM)    Contact information:   [address] [place], [place]   [id number]  [phone]      Patient denies SI/HI:   Yes,  denies    Safety Planning and Suicide Prevention discussed:  Yes,  completed with patient's mother.   Pervis HockingVenning, Sarah N 02/25/2014, 12:47 PM

## 2014-02-25 NOTE — Progress Notes (Signed)
D:  Patient's self inventory sheet, patient has fair sleep, poor appetite, hyper energy since she is leaving today.  Rated depression 3, anxiety 10, hopeless 1.  Denied withdrawals.  Denied SI.  Worst pain #1 in past 24 hours.  Needs to go directly to [facility] to get her medications after discharge, long drive.  "Please discharge me, kind of on time or around, Darel's coming long way to get me and I have to drive back."  Does have discharge plans.  No problems taking meds after discharge.   A:  Medications administered per MD orders.  Emotional support and encouragement given patient. R:  Denied SI and HI.  Denied A/V hallucinations.  Will continue to monitor patient for safety with 15 minute checks.  Safety maintained. Patient has anxiety through the roof.  Parents coming to get her, needs to be driving back after discharge.

## 2014-02-25 NOTE — [facility] Group Notes (Signed)
[facility] LCSW Group Therapy Note  Date/Time 02/25/14, 1:15-2:00pm  Type of Therapy/Topic:  Group Therapy:  Balance in Life  Participation Level:  Minimal, but attentive  Description of Group:    This group will address the concept of balance and how it feels and looks when one is unbalanced. Patients will be encouraged to process areas in their lives that are out of balance, and identify reasons for remaining unbalanced. Facilitators will guide patients utilizing problem- solving interventions to address and correct the stressor making their life unbalanced. Understanding and applying boundaries will be explored and addressed for obtaining  and maintaining a balanced life. Patients will be encouraged to explore ways to assertively make their unbalanced needs known to significant others in their lives, using other group members and facilitator for support and feedback.  Therapeutic Goals: 1. Patient will identify two or more emotions or situations they have that consume much of in their lives. 2. Patient will identify signs/triggers that life has become out of balance:  3. Patient will identify two ways to set boundaries in order to achieve balance in their lives:  4. Patient will demonstrate ability to communicate their needs through discussion and/or role plays  Summary of Patient Progress: Patient presented in an euthymic mood, affect congruent.  She was attentive during group AEB maintaining consistent eye contact and nodding in agreement with peers.  She assumed role of an observer in group, but did participate one time as she reflected upon need to reach out to others in order [facility] in [facility] to help her process her thoughts and feelings.  Patient left group prior to group ending.   Therapeutic Modalities:   Cognitive Behavioral Therapy Solution-Focused Therapy Assertiveness Training

## 2014-02-25 NOTE — Progress Notes (Signed)
The focus of this group is to educate the patient on the purpose and policies of crisis stabilization and provide a format to answer questions about their admission.  The group details unit policies and expectations of patients while admitted.  Patient stood in hallway and did not attend 0900 nurse education orientation this morning.

## 2014-02-25 NOTE — [facility] Suicide Risk Assessment (Signed)
   Demographic Factors:  48 year old divorced female, lives with parents  Total Time spent with patient: 30 minutes  Psychiatric Specialty Exam: Physical Exam  ROS  Blood pressure 123/87, pulse 82, temperature 98 F (36.7 C), temperature source Oral, resp. rate 16, height 5\' 3"  (1.6 m), weight 65.772 kg (145 lb), last menstrual period 02/18/2014.Body mass index is 25.69 kg/(m^2).  General Appearance: Well Groomed  Patent attorneyye Contact::  Good  Speech:  Normal Rate  Volume:  Normal  Mood:  Euthymic and improved compared to admission  Affect:  Appropriate  Thought Process:  Linear  Orientation:  Full (Time, Place, and Person)  Thought Content:  no psychotic symptoms  Suicidal Thoughts:  No- at this time denies any SI and contracts for safety   Homicidal Thoughts:  No  Memory:  NA  Judgement:  Good  Insight:  Fair  Psychomotor Activity:  Normal  Concentration:  Good  Recall:  Good  Fund of Knowledge:Good  Language: Good  Akathisia:  No  Handed:  Right  AIMS (if indicated):     Assets:  Desire for Improvement Housing Social Support  Sleep:  Number of Hours: 6.25    Musculoskeletal: Strength & Muscle Tone: within normal limits Gait & Station: normal Patient leans: N/A   Mental Status Per Nursing Assessment::   On Admission:  Suicidal ideation indicated by patient;Suicide plan;Plan includes specific time, place, or method;Self-harm thoughts;Intention to act on suicide plan;Belief that plan would result in death  Current Mental Status by Physician: At this time patient calm, pleasant, well related,  not suicidal or homicidal or psychotic, future oriented  Loss Factors: Decrease in vocational status  Historical Factors: Impulsivity and prior history of depression, suicidal ideations  Risk Reduction Factors:   Sense of responsibility to family, Living with another person, especially a relative, Positive social support and Positive therapeutic relationship  Continued Clinical  Symptoms:  Depression:   Severe ( although currently much improved)  Cognitive Features That Contribute To Risk:   no gross cognitive deficits or issues noted at this time   Suicide Risk:  Mild:  Suicidal ideation of limited frequency, intensity, duration, and specificity.  There are no identifiable plans, no associated intent, mild dysphoria and related symptoms, good self-control (both objective and subjective assessment), few other risk factors, and identifiable protective factors, including available and accessible social support.  Discharge Diagnoses:   AXIS I:  Major Depression, Recurrent severe AXIS II:  Deferred AXIS III:   Past Medical History  Diagnosis Date  . Depression   . Anxiety    AXIS IV:  economic problems and chronic mental illness  AXIS V: 60 -65 at this time.  Plan Of Care/Follow-up recommendations:  Activity:  regular Diet:  regular  Tests:  NA Other:  Follow Up as Below At this time patient is doing much better- she will be living with her parents who provide a supportive environment. She has been tolerating medications well, and denies side effects currently.  [facility] in [place], [place], for ongoing psychiatric management.  Also plans to see Marjorie SmolderElizabeth Manley for psychotherpay there. Has a PCP , Dr. Jennelle HumanBo Kopynek , for medical follow up if needed.  Is patient on multiple antipsychotic therapies at discharge:  No   Has Patient had three or more failed trials of antipsychotic monotherapy by history:  No  Recommended Plan for Multiple Antipsychotic Therapies: NA    COBOS, FERNANDO 02/25/2014, 8:58 AM

## 2014-02-25 NOTE — Progress Notes (Signed)
Adult Psychoeducational Group Note  Date:  02/25/2014 Time:  11:35 AM  Group Topic/Focus:  Overcoming Stress:   The focus of this group is to define stress and help patients assess their triggers.  Participation Level:  Active  Participation Quality:  Appropriate and Attentive  Affect:  Appropriate and Excited  Cognitive:  Alert, Appropriate and Oriented  Insight: Good and Improving  Engagement in Group:  Engaged and Improving  Modes of Intervention:  Discussion, Education and Support  Additional Comments:  Pt shared she is feeling stressed about leaving and going home. "It's comfortable here and safe".  Reynolds Lowery, Stephanie D 02/25/2014, 11:35 AM

## 2014-02-25 NOTE — Progress Notes (Signed)
Discharge Note:  Patient discharged home with family member.  Denied SI and HI.  Denied A/V hallucinations.  Denied pain.  Suicide prevention information given and discussed with patient who stated she understood and had no questions.  Patient stated she received all her belongings, clothing, toiletries, misc items, prescriptions, medications.  Patient stated she [facility].

## 2014-03-02 NOTE — Progress Notes (Signed)
Patient Discharge Instructions:  After Visit Summary (AVS):   Faxed to:  03/02/14 Psychiatric Admission Assessment Note:   Faxed to:  03/02/14 Suicide Risk Assessment - Discharge Assessment:   Faxed to:  03/02/14 Faxed/Sent to the Next Level Care provider:  03/02/14 Faxed to [facility] @ [phone]  Jerelene ReddenSheena E Maries, 03/02/2014, 1:30 PM

## 2014-03-13 NOTE — Discharge Summary (Signed)
Physician Discharge Summary Note  Patient:  Stephanie Lowery is an 48 y.o., female MRN:  [id number] DOB:  05/07/1966 Patient phone:  [phone] (home)  Patient address:   [address] [place] [address],  Total Time spent with patient: 30 minutes  Date of Admission:  02/19/2014 Date of Discharge: 02/25/2014  Reason for Admission:  MDD with SI with plan  Discharge Diagnoses: Active Problems:   MDD (major depressive disorder), recurrent episode, severe   Psychiatric Specialty Exam: Physical Exam  Review of Systems  Constitutional: Negative.   HENT: Negative.   Eyes: Negative.   Respiratory: Negative.   Cardiovascular: Negative.   Gastrointestinal: Negative.   Genitourinary: Negative.   Musculoskeletal: Negative.   Skin: Negative.   Neurological: Negative.   Endo/Heme/Allergies: Negative.   Psychiatric/Behavioral: Positive for depression. The patient is nervous/anxious.     Blood pressure 123/87, pulse 82, temperature 98 F (36.7 C), temperature source Oral, resp. rate 16, height 5\' 3"  (1.6 m), weight 65.772 kg (145 lb), last menstrual period 02/18/2014.Body mass index is 25.69 kg/(m^2).   General Appearance: Well Groomed   Patent attorney:: Good   Speech: Normal Rate   Volume: Normal   Mood: Euthymic and improved compared to admission   Affect: Appropriate   Thought Process: Linear   Orientation: Full (Time, Place, and Person)   Thought Content: no psychotic symptoms   Suicidal Thoughts: No- at this time denies any SI and contracts for safety   Homicidal Thoughts: No   Memory: NA   Judgement: Good   Insight: Fair   Psychomotor Activity: Normal   Concentration: Good   Recall: Good   Fund of Knowledge:Good   Language: Good   Akathisia: No   Handed: Right   AIMS (if indicated):   Assets: Desire for Improvement  Housing  Social Support   Sleep: Number of Hours: 6.25    Musculoskeletal:  Strength & Muscle Tone: within normal limits  Gait & Station:  normal  Patient leans: N/A   DSM5:  Depressive Disorders:  Major Depressive Disorder - Severe (296.23)  Axis Diagnosis:   AXIS I:  Bipolar, mixed and Panic Disorder AXIS II:  Deferred AXIS III:   Past Medical History  Diagnosis Date  . Depression   . 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  Patient has a long history of Mood Disorder. She is now followed by Dr. Elenora Gamma at above mentioned hospital. Patient contacted me yesterday By calling [facility] operator, and reported that she was having suicidal ideations, with a thought of shooting herself, and wanted to find a gun at home ( family apparently owns weapons, but have hidden them from her). Because of this,I recommended she be admitted, and family brought her to ER- now admitted. Patient states she had been on waiting list to start outpatient ECT at [facility], but a recent cholecistectomy for cholelithiasis and recovery from this procedure postponed /delayed ECT treatment so she has not started it yet. Patient stopped taking her medications ( Tegretol and Prozac ) Several days ago " because they were not working for me" (+) neuro vegetative symptoms as below.   During Hospitalization: Medications managed, psychoeducation, group and individual therapy. Pt currently denies SI, HI, and  Psychosis. At discharge, pt rates anxiety and depression as moderate. Pt states that she does have a good supportive home environment and will followup with outpatient treatment with Dr. Chryl HeckSwanson. Affirms agreement with medication regimen and discharge plan. Denies other physical and psychological concerns at time of discharge.   Consults:  None  Significant Diagnostic Studies:  None  Discharge  Vitals:   Blood pressure 123/87, pulse 82, temperature 98 F (36.7 C), temperature source Oral, resp. rate 16, height 5\' 3"  (1.6 m), weight 65.772 kg (145 lb), last menstrual period 02/18/2014. Body mass index is 25.69 kg/(m^2). Lab Results:   No results found for this or any previous visit (from the past 72 hour(s)).  Physical Findings: AIMS: Facial and Oral Movements Muscles of Facial Expression: None, normal Lips and Perioral Area: None, normal Jaw: None, normal Tongue: None, normal,Extremity Movements Upper (arms, wrists, hands, fingers): None, normal Lower (legs, knees, ankles, toes): None, normal, Trunk Movements Neck, shoulders, hips: None, normal, Overall Severity Severity of abnormal movements (highest score from questions above): None, normal Incapacitation due to abnormal movements: None, normal Patient's awareness of abnormal movements (rate only patient's report): No Awareness, Dental Status Current problems with teeth and/or dentures?: No Does patient usually wear dentures?: No  CIWA:  CIWA-Ar Total: 2 COWS:  COWS Total Score: 1  Psychiatric Specialty Exam: See Psychiatric Specialty Exam and Suicide Risk Assessment completed by Attending Physician prior to discharge.  Discharge destination:  Home  Is patient on multiple antipsychotic therapies at discharge:  No   Has Patient had three or more failed trials of antipsychotic monotherapy by history:  No  Recommended Plan for Multiple Antipsychotic Therapies: NA     Medication List    STOP taking these medications       BRINTELLIX 10 MG Tabs  Generic drug:  Vortioxetine HBr     PATADAY 0.2 % Soln  Generic drug:  Olopatadine HCl     triamcinolone cream 0.1 %  Commonly known as:  KENALOG      TAKE these medications     Indication   ARIPiprazole 2 MG tablet  Commonly known as:  ABILIFY  Take 1 tablet (2 mg total) by mouth at bedtime.   Indication:  Major Depressive Disorder, Manic Phase of  Manic-Depression, insomnia     busPIRone 5 MG tablet  Commonly known as:  BUSPAR  Take 1 tablet (5 mg total) by mouth 2 (two) times daily.   Indication:  mood stabilization     escitalopram 5 MG tablet  Commonly known as:  LEXAPRO  Take 1 tablet (5 mg total) by mouth daily.   Indication:  Depression     hydrOXYzine 50 MG tablet  Commonly known as:  ATARAX/VISTARIL  Take 1 tablet (50 mg total) by mouth at bedtime.   Indication:  insomnia     lithium carbonate 150 MG capsule  Take 1 capsule (150 mg total) by mouth 2 (two) times daily with a meal.   Indication:  Manic-Depression     norethindrone-ethinyl estradiol 1-20 MG-MCG tablet  Commonly known as:  MICROGESTIN  Take 1 tablet by mouth daily.   Indication:  hormonal balance           Follow-up Information   [facility] On 03/08/2014. (Monday, March 08, 2014 at 3:45 PM)    Contact information:   [address] [place], [place]   [id number]  [phone]      Follow up with Marjorie SmolderElizabeth Manley -  [facility] On 03/22/2014. (Monday, March 22, 2014 at 4:00 PM)    Contact information:   [address] [place], [place]   [id number]  [phone]      Follow-up recommendations:  Activity:  As tolerated Diet:  Heart healthy with low sodium.  Comments:   Take all medications as prescribed. Keep all follow-up appointments as scheduled.  Do not consume alcohol or use illegal drugs while on prescription medications. Report any adverse effects from your medications to your primary care provider promptly.  In the event of recurrent symptoms or worsening symptoms, call 911, a crisis hotline, or go to the nearest emergency department for evaluation.   Total Discharge Time:  Greater than 30 minutes.  Signed: Beau FannyWithrow, John C, FNP-BC 02/25/2014, 1:46 PM

## 2014-03-17 NOTE — Discharge Summary (Signed)
Patient seen, Suicide Assessment Completed.  Disposition Plan Reviewed  

## 2014-10-10 ENCOUNTER — Emergency Department (HOSPITAL_COMMUNITY)
Admission: EM | Admit: 2014-10-10 | Discharge: 2014-10-11 | Disposition: A | Discharge status: Other Acute Inpatient Hospital | Payer: Medicare Other | Attending: Emergency Medicine | Admitting: Emergency Medicine

## 2014-10-10 ENCOUNTER — Emergency Department (HOSPITAL_COMMUNITY): Payer: Medicare Other

## 2014-10-10 ENCOUNTER — Encounter (HOSPITAL_COMMUNITY): Payer: Self-pay | Admitting: Emergency Medicine

## 2014-10-10 DIAGNOSIS — Y9289 Other specified places as the place of occurrence of the external cause: Secondary | ICD-10-CM | POA: Insufficient documentation

## 2014-10-10 DIAGNOSIS — W2201XA Walked into wall, initial encounter: Secondary | ICD-10-CM | POA: Insufficient documentation

## 2014-10-10 DIAGNOSIS — M79641 Pain in right hand: Secondary | ICD-10-CM

## 2014-10-10 DIAGNOSIS — Y9389 Activity, other specified: Secondary | ICD-10-CM | POA: Diagnosis not present

## 2014-10-10 DIAGNOSIS — F419 Anxiety disorder, unspecified: Secondary | ICD-10-CM | POA: Diagnosis not present

## 2014-10-10 DIAGNOSIS — F332 Major depressive disorder, recurrent severe without psychotic features: Secondary | ICD-10-CM | POA: Diagnosis present

## 2014-10-10 DIAGNOSIS — Z3202 Encounter for pregnancy test, result negative: Secondary | ICD-10-CM | POA: Insufficient documentation

## 2014-10-10 DIAGNOSIS — Z79899 Other long term (current) drug therapy: Secondary | ICD-10-CM | POA: Diagnosis not present

## 2014-10-10 DIAGNOSIS — S61210A Laceration without foreign body of right index finger without damage to nail, initial encounter: Secondary | ICD-10-CM | POA: Insufficient documentation

## 2014-10-10 DIAGNOSIS — S6991XA Unspecified injury of right wrist, hand and finger(s), initial encounter: Secondary | ICD-10-CM | POA: Diagnosis not present

## 2014-10-10 DIAGNOSIS — Y998 Other external cause status: Secondary | ICD-10-CM | POA: Insufficient documentation

## 2014-10-10 DIAGNOSIS — F32A Depression, unspecified: Secondary | ICD-10-CM

## 2014-10-10 DIAGNOSIS — F1310 Sedative, hypnotic or anxiolytic abuse, uncomplicated: Secondary | ICD-10-CM | POA: Diagnosis not present

## 2014-10-10 DIAGNOSIS — F329 Major depressive disorder, single episode, unspecified: Secondary | ICD-10-CM | POA: Diagnosis present

## 2014-10-10 LAB — CBC WITH DIFFERENTIAL/PLATELET
Basophils Absolute: 0 10*3/uL (ref 0.0–0.1)
Basophils Relative: 1 % (ref 0–1)
Eosinophils Absolute: 0.5 10*3/uL (ref 0.0–0.7)
Eosinophils Relative: 7 % — ABNORMAL HIGH (ref 0–5)
HEMATOCRIT: 40.7 % (ref 36.0–46.0)
Hemoglobin: 13.7 g/dL (ref 12.0–15.0)
LYMPHS PCT: 29 % (ref 12–46)
Lymphs Abs: 2.3 10*3/uL (ref 0.7–4.0)
MCH: 34.3 pg — ABNORMAL HIGH (ref 26.0–34.0)
MCHC: 33.7 g/dL (ref 30.0–36.0)
MCV: 102 fL — ABNORMAL HIGH (ref 78.0–100.0)
Monocytes Absolute: 0.5 10*3/uL (ref 0.1–1.0)
Monocytes Relative: 6 % (ref 3–12)
Neutro Abs: 4.5 10*3/uL (ref 1.7–7.7)
Neutrophils Relative %: 57 % (ref 43–77)
PLATELETS: 292 10*3/uL (ref 150–400)
RBC: 3.99 MIL/uL (ref 3.87–5.11)
RDW: 11.6 % (ref 11.5–15.5)
WBC: 7.9 10*3/uL (ref 4.0–10.5)

## 2014-10-10 LAB — PREGNANCY, URINE: Preg Test, Ur: NEGATIVE

## 2014-10-10 MED ORDER — LORAZEPAM 1 MG PO TABS: 1.0000 mg | ORAL_TABLET | Freq: Three times a day (TID) | ORAL | Status: DC | PRN

## 2014-10-10 MED ORDER — ONDANSETRON HCL 4 MG PO TABS: 4.0000 mg | ORAL_TABLET | Freq: Three times a day (TID) | ORAL | Status: DC | PRN

## 2014-10-10 MED ORDER — ACETAMINOPHEN 325 MG PO TABS: 650.0000 mg | ORAL_TABLET | ORAL | Status: DC | PRN

## 2014-10-10 MED ORDER — ALUM & MAG HYDROXIDE-SIMETH 200-200-20 MG/5ML PO SUSP: 30.0000 mL | ORAL | Status: DC | PRN

## 2014-10-10 MED ORDER — NICOTINE 21 MG/24HR TD PT24: 21.0000 mg | MEDICATED_PATCH | Freq: Every day | TRANSDERMAL | Status: DC

## 2014-10-10 NOTE — BH Assessment (Signed)
Received notification of TTS consult request. Spoke to Antony MaduraKelly Humes, PA-C who said Pt's mother is concerned that Pt is depressed and Pt has a history of overdose. Tele-assessment will be initiated.  Harlin RainFord Ellis Ria CommentWarrick Jr, LPC, [facility] Triage Specialist [phone]

## 2014-10-10 NOTE — BH Assessment (Addendum)
Tele Assessment Note   Stephanie Lowery is an 49 y.o. female, single, Caucasian who presents to [facility] accompanied by her mother, who participated in assessment. Pt has a history of bipolar disorder and anxiety and reports she had a conflict with her brother, which triggered her anger and depression. At her home Pt punched a wall, threw a microwave oven, broke a lamp and peeled the lamination off her kitchen cabinets. Pt scales her depression as 10/10. She reports symptoms including crying spells, fatigue, decreased and interrupted sleep, decreased appetite with ten pound weight loss, social withdrawal, anger outbursts and feelings of hopelessness. She says "I want to die" but denies current plan. Pt has a history of at least five previous suicide attempts including overdosing on medication and threatening to shoot herself with a gun. Pt reports having panic attacks and had a panic attack earlier today. Pt states she sits in her bedroom closet for hours. Pt's mother reports Pt wants to stay in bed all the time. Pt states she has a history of cutting behaviors but has not cut in years. Pt denies homicidal ideation or physical aggression towards other people. She denies any psychotic symptoms. She denies any alcohol or substance abuse.  Pt identifies her primary stressor as conflicts with her brother. She lives alone but frequently spends time at her parent's home. She states her parents are her only support. Pt denies any history of abuse or trauma.  Pt is currently in outpatient treatment with Dionne Bucy for medication management. Pt states her next appointment is 10/20/14. Her last inpatient psychiatric admission was 02/2014 at [facility] and prior to that Pt reports she has been hospitalized seven times at [facility]. Pt reports she is compliant with her medications, which include Luvox, Ativan and Xanax.   Pt is alert, oriented x4 with normal speech and normal motor behavior. Eye  contact is good and Pt is tearful during assessment.. Pt's mood is depressed and sad and affect is congruent with mood. Thought process is coherent and relevant. There is no indication Pt is currently responding to internal stimuli or experiencing delusional thought content. Pt was cooperative throughout assessment. She agrees she could benefit from inpatient psychiatric hospitalization.   Axis I: Bipolar I Disorder, Current Epsiode Depressed, Severe Axis II: Cluster B Traits Axis III:  Past Medical History  Diagnosis Date  . Depression   . Anxiety    Axis IV: other psychosocial or environmental problems Axis V: GAF=30  Past Medical History:  Past Medical History  Diagnosis Date  . Depression   . Anxiety     Past Surgical History  Procedure Laterality Date  . Gallbladder surgery      Family History: History reviewed. No pertinent family history.  Social History:  reports that she has never smoked. She has never used smokeless tobacco. She reports that she does not drink alcohol or use illicit drugs.  Additional Social History:  Alcohol / Drug Use Pain Medications: Denies abuse Prescriptions: Denies abuse Over the Counter: Denies abuse History of alcohol / drug use?: No history of alcohol / drug abuse Longest period of sobriety (when/how long): NA  CIWA: CIWA-Ar BP: 116/77 mmHg Pulse Rate: 78 COWS:    PATIENT STRENGTHS: (choose at least two) Ability for insight Average or above average intelligence Capable of independent living Communication skills Financial means General fund of knowledge Motivation for treatment/growth Supportive family/friends  Allergies:  Allergies  Allergen Reactions  . Ambien [Zolpidem Tartrate] Other (See  Comments)    jittery  . Haldol [Haloperidol Lactate]   . Lunesta [Eszopiclone]     Jittery   . Promethazine     jittery  . Trazodone And Nefazodone Other (See Comments)    Migraines     Home Medications:  (Not in a hospital  admission)  OB/GYN Status:  Patient's last menstrual period was 09/19/2014.  General Assessment Data Location of Assessment: [facility] Is this a Tele or Face-to-Face Assessment?: Tele Assessment Is this an Initial Assessment or a Re-assessment for this encounter?: Initial Assessment Living Arrangements: Alone Can pt return to current living arrangement?: Yes Admission Status: Voluntary Is patient capable of signing voluntary admission?: Yes Transfer from: Home Referral Source: Self/Family/Friend     [facility] Crisis Care Plan Living Arrangements: Alone Name of Psychiatrist: Dionne Bucy Name of Therapist: None  Education Status Is patient currently in school?: No Current Grade: NA Highest grade of school patient has completed: NA Name of school: NA Contact person: NA  Risk to self with the past 6 months Suicidal Ideation: Yes-Currently Present Suicidal Intent: No Is patient at risk for suicide?: Yes Suicidal Plan?: No Access to Means: No What has been your use of drugs/alcohol within the last 12 months?: Pt denies Previous Attempts/Gestures: Yes How many times?: 5 Other Self Harm Risks: Pt has history of cutting and bruising herself Triggers for Past Attempts: Family contact, Other personal contacts Intentional Self Injurious Behavior: Cutting, Bruising Comment - Self Injurious Behavior: Pt has a history of cutting and bruising herself Family Suicide History: Yes (History of completed suicide on mother's side) Recent stressful life event(s): Conflict (Comment) (Conflict with brother) Persecutory voices/beliefs?: No Depression: Yes Depression Symptoms: Despondent, Tearfulness, Isolating, Fatigue, Loss of interest in usual pleasures, Feeling worthless/self pity, Feeling angry/irritable Substance abuse history and/or treatment for substance abuse?: No Suicide prevention information given to non-admitted patients: Not applicable  Risk to Others within the past 6 months Homicidal  Ideation: No Thoughts of Harm to Others: No Current Homicidal Intent: No Current Homicidal Plan: No Access to Homicidal Means: No Identified Victim: None History of harm to others?: No Assessment of Violence: On admission Violent Behavior Description: Pt destroyed property at her home Does patient have access to weapons?: No Criminal Charges Pending?: No Does patient have a court date: No  Psychosis Hallucinations: None noted Delusions: None noted  Mental Status Report Appear/Hygiene: Other (Comment) (Casually dressed) Eye Contact: Good Motor Activity: Unremarkable Speech: Logical/coherent Level of Consciousness: Alert, Crying Mood: Depressed Affect: Depressed, Sad Anxiety Level: Panic Attacks Panic attack frequency: 2-3 times per week Most recent panic attack: Today Thought Processes: Coherent, Relevant Judgement: Partial Orientation: Person, Time, Place, Situation, Appropriate for developmental age Obsessive Compulsive Thoughts/Behaviors: None  Cognitive Functioning Concentration: Normal Memory: Recent Intact, Remote Intact IQ: Average Insight: Fair Impulse Control: Poor Appetite: Poor Weight Loss: 10 Weight Gain: 0 Sleep: Decreased Total Hours of Sleep: 6 Vegetative Symptoms: Staying in bed  ADLScreening [facility] Assessment Services) Patient's cognitive ability adequate to safely complete daily activities?: Yes Patient able to express need for assistance with ADLs?: Yes Independently performs ADLs?: Yes (appropriate for developmental age)  Prior Inpatient Therapy Prior Inpatient Therapy: Yes Prior Therapy Dates: 02/2014; seven other hospitalizations Prior Therapy Facilty/Provider(s): [facility]; [facility] Reason for Treatment: Depression  Prior Outpatient Therapy Prior Outpatient Therapy: Yes Prior Therapy Dates: Current Prior Therapy Facilty/Provider(s): Dionne Bucy Reason for Treatment: Depression  ADL Screening (condition at time of  admission) Patient's cognitive ability adequate to safely complete  daily activities?: Yes Is the patient deaf or have difficulty hearing?: No Does the patient have difficulty seeing, even when wearing glasses/contacts?: No Does the patient have difficulty concentrating, remembering, or making decisions?: No Patient able to express need for assistance with ADLs?: Yes Does the patient have difficulty dressing or bathing?: No Independently performs ADLs?: Yes (appropriate for developmental age) Does the patient have difficulty walking or climbing stairs?: No Weakness of Legs: None Weakness of Arms/Hands: None  Home Assistive Devices/Equipment Home Assistive Devices/Equipment: None    Abuse/Neglect Assessment (Assessment to be complete while patient is alone) Physical Abuse: Denies Verbal Abuse: Denies Sexual Abuse: Denies Exploitation of patient/patient's resources: Denies Self-Neglect: Denies     Merchant navy officerAdvance Directives (For Healthcare) Does patient have an advance directive?: No Would patient like information on creating an advanced directive?: No - patient declined information    Additional Information 1:1 In Past 12 Months?: No CIRT Risk: No Elopement Risk: No Does patient have medical clearance?: Yes     Disposition: Binnie RailJoann Glover, [facility], confirms adult unit is currently at capacity. Gave clinical report to Janann Augustori Burkett, NP who agrees Pt meets criteria for inpatient psychiatric treatment. TTS will contact other facilities for placement. Notified Antony MaduraKelly Humes, PA-C and Alden BenjaminBlanche Barrett, RN of recommendation.  Disposition Initial Assessment Completed for this Encounter: Yes Disposition of Patient: Inpatient treatment program [facility]   Pamalee LeydenFord Ellis Harli Engelken Jr, [facility], [facility] Triage Specialist [phone]   Pamalee LeydenWarrick Jr, Tersea Aulds Ellis 10/10/2014 10:42 PM

## 2014-10-10 NOTE — BH Assessment (Signed)
Assessment complete. Stephanie Lowery, [facility], confirms adult unit is currently at capacity. Gave clinical report to Janann Augustori Burkett, NP who agrees Pt meets criteria for inpatient psychiatric treatment. TTS will contact other facilities for placement. Notified Antony MaduraKelly Humes, PA-C and Alden BenjaminBlanche Barrett, RN of recommendation.  Harlin RainFord Ellis Ria CommentWarrick Jr, LPC, [facility] Triage Specialist [phone]

## 2014-10-10 NOTE — ED Notes (Signed)
Ford with BH called and reported that pt meets in-patient criteria and [facility] will be looking for placement.

## 2014-10-10 NOTE — ED Provider Notes (Signed)
CSN: [id number]     Arrival date & time 10/10/14  2016 History   First MD Initiated Contact with Patient 10/10/14 2052     Chief Complaint  Patient presents with  . Hand Injury  . Depression     (Consider location/radiation/quality/duration/timing/severity/associated sxs/prior Treatment) HPI Comments: Patient is a 49 year old female with a history of anxiety and depression who presents to the emergency department for worsening depression. Patient states that her medicines usually help manage her symptoms, but she has difficulty controlling her emotions with certain triggers. Patient states that she got mad this evening and slapped a wall with her palm. She is complaining of aching pain in her right hand which is worse with movement. No changes in sensation or pallor. Last tetanus unknown. No suicidal or homicidal thoughts at present. Mother is concerned about worsening depression as patient has a history of overdose.  Patient is a 49 y.o. female presenting with hand injury. The history is provided by the patient. No language interpreter was used.  Hand Injury   Past Medical History  Diagnosis Date  . Depression   . Anxiety    Past Surgical History  Procedure Laterality Date  . Gallbladder surgery     History reviewed. No pertinent family history. History  Substance Use Topics  . Smoking status: Never Smoker   . Smokeless tobacco: Never Used  . Alcohol Use: No   OB History    No data available      Review of Systems  Musculoskeletal: Positive for myalgias.  Skin: Positive for wound. Negative for color change.  Neurological: Negative for weakness and numbness.  Psychiatric/Behavioral: Positive for behavioral problems. Negative for suicidal ideas.  All other systems reviewed and are negative.   Allergies  Ambien; Haldol; Lunesta; Promethazine; and Trazodone and nefazodone  Home Medications   Prior to Admission medications   Medication Sig Start Date End Date Taking?  Authorizing Provider  ALPRAZolam Prudy Feeler(XANAX) 0.5 MG tablet Take 2 mg by mouth 2 (two) times daily as needed for anxiety (anxiety).   Yes Historical Provider, MD  diphenhydrAMINE (BENADRYL) 25 MG tablet Take 25-50 mg by mouth every 6 (six) hours as needed for itching (itching).   Yes Historical Provider, MD  fluvoxaMINE (LUVOX) 100 MG tablet Take 100 mg by mouth at bedtime.   Yes Historical Provider, MD  LORazepam (ATIVAN) 0.5 MG tablet Take 0.5 mg by mouth at bedtime.   Yes Historical Provider, MD  Melatonin 3 MG TABS Take 1 tablet by mouth at bedtime.   Yes Historical Provider, MD  Melatonin 5 MG TABS Take 1 tablet by mouth at bedtime.   Yes Historical Provider, MD  polyethylene glycol (MIRALAX / GLYCOLAX) packet Take 17 g by mouth daily as needed for mild constipation (constipation).   Yes Historical Provider, MD  Tetrahydrozoline HCl (EYE DROPS OP) Place 2 drops into both eyes 3 (three) times daily as needed (itchy eyes).   Yes Historical Provider, MD  ARIPiprazole (ABILIFY) 2 MG tablet Take 1 tablet (2 mg total) by mouth at bedtime. Patient not taking: Reported on 10/10/2014 02/25/14   Beau FannyJohn C Withrow, FNP  busPIRone (BUSPAR) 5 MG tablet Take 1 tablet (5 mg total) by mouth 2 (two) times daily. Patient not taking: Reported on 10/10/2014 02/25/14   Beau FannyJohn C Withrow, FNP  escitalopram (LEXAPRO) 5 MG tablet Take 1 tablet (5 mg total) by mouth daily. Patient not taking: Reported on 10/10/2014 02/25/14   Beau FannyJohn C Withrow, FNP  hydrOXYzine (ATARAX/VISTARIL) 50 MG tablet  Take 1 tablet (50 mg total) by mouth at bedtime. Patient not taking: Reported on 10/10/2014 02/25/14   Beau Fanny, FNP  lithium carbonate 150 MG capsule Take 1 capsule (150 mg total) by mouth 2 (two) times daily with a meal. Patient not taking: Reported on 10/10/2014 02/25/14   Beau Fanny, FNP  norethindrone-ethinyl estradiol (MICROGESTIN) 1-20 MG-MCG tablet Take 1 tablet by mouth daily. Patient not taking: Reported on 10/10/2014 02/25/14   Everardo All Withrow, FNP   BP 118/80 mmHg  Pulse 62  Temp(Src) 98 F (36.7 C) (Oral)  Resp 18  SpO2 98%  LMP 09/19/2014   Physical Exam  Constitutional: She is oriented to person, place, and time. She appears well-developed and well-nourished. No distress.  Nontoxic/nonseptic appearing  HENT:  Head: Normocephalic and atraumatic.  Eyes: Conjunctivae and EOM are normal. No scleral icterus.  Neck: Normal range of motion.  Cardiovascular: Normal rate, regular rhythm and intact distal pulses.   Distal radial pulse 2+ in right upper extremity.  Pulmonary/Chest: Effort normal. No respiratory distress.  Musculoskeletal: Normal range of motion.       Right wrist: Normal.       Right hand: She exhibits tenderness. She exhibits normal range of motion, no bony tenderness, normal capillary refill, no deformity and no swelling. Normal sensation noted. Normal strength noted.  Neurological: She is alert and oriented to person, place, and time. She exhibits normal muscle tone. Coordination normal.  Sensation to light touch intact. Normal finger to thumb opposition. 5/5 grip strength in right hand.  Skin: Skin is warm and dry. No rash noted. She is not diaphoretic. No erythema. No pallor.  0.5 cm superficial laceration noted to the palmar aspect of the right index finger  Psychiatric: Her speech is normal. She is withdrawn. She exhibits a depressed mood. She expresses no homicidal and no suicidal ideation. She expresses no suicidal plans and no homicidal plans.  Nursing note and vitals reviewed.   ED Course  Procedures (including critical care time) Labs Review Labs Reviewed  CBC WITH DIFFERENTIAL/PLATELET - Abnormal; Notable for the following:    MCV 102.0 (*)    MCH 34.3 (*)    Eosinophils Relative 7 (*)    All other components within normal limits  COMPREHENSIVE METABOLIC PANEL - Abnormal; Notable for the following:    GFR calc non Af Amer 69 (*)    GFR calc Af Amer 80 (*)    Anion gap 4 (*)     All other components within normal limits  URINE RAPID DRUG SCREEN (HOSP PERFORMED) - Abnormal; Notable for the following:    Benzodiazepines POSITIVE (*)    All other components within normal limits  PREGNANCY, URINE  ETHANOL    Imaging Review Dg Hand Complete Right  10/10/2014   CLINICAL DATA:  Patient slapped wall with open palm  EXAM: RIGHT HAND - COMPLETE 3+ VIEW  COMPARISON:  None.  FINDINGS: Frontal, oblique, and lateral views were obtained. There is no demonstrable fracture or dislocation. Joint spaces appear intact. No erosive change.  IMPRESSION: No fracture or dislocation.  No appreciable arthropathy.   Electronically Signed   By: Bretta Bang M.D.   On: 10/10/2014 21:12     EKG Interpretation None      MDM   Final diagnoses:  Depression  Right hand pain    48 year old female presents to the emergency department for further evaluation of worsening depression. This caused her to become angry this evening and hit  a wall. Patient neurovascularly intact. Imaging negative for fracture. Symptoms consistent with likely contusion from impact. Patient also evaluated by TTS. She meets inpatient criteria for further psychiatric management. Patient medically cleared and currently pending placement. No beds available at [facility].   Filed Vitals:   10/10/14 2034 10/10/14 2200  BP: 116/77 118/80  Pulse: 78 62  Temp: 98 F (36.7 C)   TempSrc: Oral   Resp: 20 18  SpO2: 99% 98%       Antony Madura, PA-C 10/11/14 0034  Toy Baker, MD 10/13/14 [phone]

## 2014-10-10 NOTE — ED Notes (Signed)
Tresa EndoKelly, PA made aware of UA with visible bloody clots prior to sending to lab d/t pt having menstrual at this time. Verbalized understanding and continue with order for clean catch.

## 2014-10-10 NOTE — ED Notes (Signed)
Pt taken to room to speak with TTS.

## 2014-10-10 NOTE — ED Notes (Signed)
Awake. Verbally responsive. Resp even and unlabored. ABC's intact. Family at bedside. No behavior problems noted. NAD noted. Waiting on BH.

## 2014-10-10 NOTE — ED Notes (Signed)
Kelly, PA at bedside. 

## 2014-10-10 NOTE — ED Notes (Signed)
Patient states that her depression and anger 'exploded' today and she hit a wall with her R hand. States that she now has a laceration on her R hand and it is painful. Denies SI/HI.

## 2014-10-10 NOTE — ED Notes (Signed)
Pt returned from X-ray without distress noted. 

## 2014-10-10 NOTE — ED Notes (Signed)
Patient transported to X-ray 

## 2014-10-10 NOTE — ED Notes (Addendum)
Pt reported that she wanted to die but she did not want to kill herself or anyone. Mother reported that pt had destroyed home by throwing items and believes something the brother said and not being able to get her computer fix that might have trigger her anger. Noted small amt of dried blood between rt 3rd/4th finger. After cleansing area noted very small superficial lac. Area left open to air.

## 2014-10-11 ENCOUNTER — Encounter (HOSPITAL_COMMUNITY): Payer: Self-pay | Admitting: *Deleted

## 2014-10-11 ENCOUNTER — Inpatient Hospital Stay (HOSPITAL_COMMUNITY)
Admission: EM | Admit: 2014-10-11 | Discharge: 2014-10-14 | DRG: 885 | Disposition: A | Discharge status: Home / Self Care | Payer: Federal, State, Local not specified - Other | Source: Intra-hospital | Attending: Psychiatry | Admitting: Psychiatry

## 2014-10-11 DIAGNOSIS — F410 Panic disorder [episodic paroxysmal anxiety] without agoraphobia: Secondary | ICD-10-CM | POA: Diagnosis present

## 2014-10-11 DIAGNOSIS — G4700 Insomnia, unspecified: Secondary | ICD-10-CM | POA: Diagnosis present

## 2014-10-11 DIAGNOSIS — F329 Major depressive disorder, single episode, unspecified: Secondary | ICD-10-CM | POA: Diagnosis not present

## 2014-10-11 DIAGNOSIS — R45851 Suicidal ideations: Secondary | ICD-10-CM | POA: Diagnosis present

## 2014-10-11 DIAGNOSIS — F332 Major depressive disorder, recurrent severe without psychotic features: Principal | ICD-10-CM | POA: Diagnosis present

## 2014-10-11 LAB — RAPID URINE DRUG SCREEN, HOSP PERFORMED
Amphetamines: NOT DETECTED
BARBITURATES: NOT DETECTED
Benzodiazepines: POSITIVE — AB
Cocaine: NOT DETECTED
OPIATES: NOT DETECTED
TETRAHYDROCANNABINOL: NOT DETECTED

## 2014-10-11 LAB — COMPREHENSIVE METABOLIC PANEL
ALBUMIN: 3.7 g/dL (ref 3.5–5.2)
ALT: 15 U/L (ref 0–35)
AST: 21 U/L (ref 0–37)
Alkaline Phosphatase: 85 U/L (ref 39–117)
Anion gap: 4 — ABNORMAL LOW (ref 5–15)
BUN: 13 mg/dL (ref 6–23)
CHLORIDE: 104 mmol/L (ref 96–112)
CO2: 28 mmol/L (ref 19–32)
CREATININE: 0.96 mg/dL (ref 0.50–1.10)
Calcium: 8.7 mg/dL (ref 8.4–10.5)
GFR calc Af Amer: 80 mL/min — ABNORMAL LOW (ref >90)
GFR calc non Af Amer: 69 mL/min — ABNORMAL LOW (ref >90)
Glucose, Bld: 98 mg/dL (ref 70–99)
Potassium: 4 mmol/L (ref 3.5–5.1)
SODIUM: 136 mmol/L (ref 135–145)
Total Bilirubin: 0.5 mg/dL (ref 0.3–1.2)
Total Protein: 6.7 g/dL (ref 6.0–8.3)

## 2014-10-11 LAB — ETHANOL: Alcohol, Ethyl (B): <5 mg/dL (ref 0–9)

## 2014-10-11 MED ORDER — DIPHENHYDRAMINE HCL 25 MG PO TABS
25.0000 mg | ORAL_TABLET | Freq: Four times a day (QID) | ORAL | Status: DC | PRN
  Filled 2014-10-11: qty 2

## 2014-10-11 MED ORDER — TEMAZEPAM 15 MG PO CAPS
30.0000 mg | ORAL_CAPSULE | Freq: Every evening | ORAL | Status: DC | PRN
  Administered 2014-10-11: 30 mg via ORAL
  Filled 2014-10-11: qty 2

## 2014-10-11 MED ORDER — DIPHENHYDRAMINE-ZINC ACETATE 2-0.1 % EX CREA
1.0000 "application " | TOPICAL_CREAM | Freq: Three times a day (TID) | CUTANEOUS | Status: DC | PRN
  Administered 2014-10-11: 1 via TOPICAL
  Filled 2014-10-11: qty 28

## 2014-10-11 MED ORDER — FLUVOXAMINE MALEATE 100 MG PO TABS
100.0000 mg | ORAL_TABLET | Freq: Every day | ORAL | Status: DC
  Administered 2014-10-11 – 2014-10-14 (×3): 100 mg via ORAL
  Filled 2014-10-11 (×2): qty 1
  Filled 2014-10-11: qty 2
  Filled 2014-10-11 (×3): qty 1

## 2014-10-11 MED ORDER — ALPRAZOLAM 1 MG PO TABS: 2.0000 mg | ORAL_TABLET | Freq: Two times a day (BID) | ORAL | Status: DC | PRN

## 2014-10-11 MED ORDER — DIPHENHYDRAMINE-ZINC ACETATE 2-0.1 % EX CREA
1.0000 "application " | TOPICAL_CREAM | Freq: Three times a day (TID) | CUTANEOUS | Status: DC | PRN
  Administered 2014-10-12 – 2014-10-13 (×2): 1 via TOPICAL

## 2014-10-11 MED ORDER — FLUVOXAMINE MALEATE 100 MG PO TABS
100.0000 mg | ORAL_TABLET | Freq: Every day | ORAL | Status: DC
  Filled 2014-10-11: qty 1

## 2014-10-11 MED ORDER — POLYETHYLENE GLYCOL 3350 17 G PO PACK
17.0000 g | PACK | Freq: Every day | ORAL | Status: DC | PRN
  Administered 2014-10-12 – 2014-10-14 (×3): 17 g via ORAL
  Filled 2014-10-11 (×3): qty 1

## 2014-10-11 MED ORDER — TETRAHYDROZOLINE HCL 0.05 % OP SOLN
2.0000 [drp] | Freq: Three times a day (TID) | OPHTHALMIC | Status: DC | PRN
  Filled 2014-10-11: qty 15

## 2014-10-11 NOTE — B[facility] Counselor (Signed)
Per Tressa pt accepted to [facility] by Dr. Pucilowska. Bed assignments pending.  Eartha Vonbehren, M.S., LPCA, NCC Licensed Professional Counselor Associate  Triage Specialist  [facility]  Therapeutic Triage Services Phone: [phone] Fax: [phone] 

## 2014-10-11 NOTE — Progress Notes (Signed)
Client is a 49 yo female admitted after having "a moment" "I was throwing things" Client sites stressors as "brother and I having problems" "I brought a [employer]ell computer and the mother board gave out and when I tried to return it they didn't want to work with me the store Building surveyor([employer]) or the credit card company" "I got angry and just threw things across the room" "an $850.00 computer thrown away in a moment of anger" "it was stupid" "feel like people are taking advantage of me" "my brother wouldn't help me, he says because I didn't get the kind of computer he says, then I'm on my own, and he could help me, he has the money" Client reports goal is "medication management" "gottal learn how to handle anger" she also reports history of purging, using laxatives (esp. Magnesium citrate, "the cherry kind") and taking pills.  "I always had this thing about my weight, thinking if I lose the weight problems will go away" Client is pleasant and assertive during this admission, fidgety, but makes eye contact. Client denies SHI. "I have a strong faith in God" Client will be monitored q915min for safety. Client is safe on the unit.

## 2014-10-11 NOTE — ED Notes (Signed)
Resting quietly with eye closed. Easily arousable. Verbally responsive. Resp even and unlabored. ABC's intact. No behavior problems noted. NAD noted.  

## 2014-10-11 NOTE — Tx Team (Addendum)
Initial Interdisciplinary Treatment Plan   PATIENT STRESSORS: Financial difficulties Marital or family conflict Medication change or noncompliance   PATIENT STRENGTHS: Ability for insight Communication skills General fund of knowledge Motivation for treatment/growth Religious Affiliation Supportive family/friends   PROBLEM LIST: Problem List/Patient Goals Date to be addressed Date deferred Reason deferred Estimated date of resolution  "throwing things, anger moments" 10-11-14     "brother and I having problems" 10-11-14     "feel like people taking advantage of me" 10-11-14     "gotta learn how to handle anger" 10-11-14     "medication management" 10-11-14     "have habit of purging" 10-11-14     Depression 10-13-14     Suicidal ideation, no plan 10-13-14     Anxiety, anger issues 10-13-14      DISCHARGE CRITERIA:  Improved stabilization in mood, thinking, and/or behavior Motivation to continue treatment in a less acute level of care Need for constant or close observation no longer present Verbal commitment to aftercare and medication compliance  PRELIMINARY DISCHARGE PLAN: Outpatient therapy Participate in family therapy Return to previous living arrangement  PATIENT/FAMIILY INVOLVEMENT: This treatment plan has been presented to and reviewed with the patient, Stephanie Lowery, and/or family member.  The patient and family have been given the opportunity to ask questions and make suggestions.  Mickeal NeedyJohnson, Edmar Blankenburg N 10/11/2014, 11:48 PM

## 2014-10-11 NOTE — Progress Notes (Signed)
  CARE MANAGEMENT ED NOTE 10/11/2014  [facility]  [facility]ven   Account Number:  [id number]  Date Initiated:  10/11/2014  Documentation initiated by:  Edd ArbourGIBBS,KIMBERLY  Subjective/Objective Assessment:   49 yr old medicare pt c/o hand injury and depression dx MDD (major depressive disorder), recurrent episode, severe     Subjective/Objective Assessment Detail:   pcp Dr Johnston EbbsKopynec  Pt very pleasant and cooperative     Action/Plan:   EPIC update   Action/Plan Detail:   Anticipated DC Date:       Status Recommendation to Physician:   Result of Recommendation:    Other ED Services  Consult Working Psychologist, educationallan    DC Planning Services  Other  Outpatient Services - Pt will follow up  PCP issues    Choice offered to / List presented to:            Status of service:  Completed, signed off  ED Comments:   ED Comments Detail:

## 2014-10-11 NOTE — BHH Group Notes (Signed)
Adult Psychoeducational Group Note  Date:  10/11/2014 Time:  10:22 PM  Group Topic/Focus:  Wrap-Up Group:   The focus of this group is to help patients review their daily goal of treatment and discuss progress on daily workbooks.  Participation Level:  Did Not Attend  Participation Quality:  None  Affect:  None  Cognitive:  None  Insight: None  Engagement in Group:  None  Modes of Intervention:  Discussion  Additional Comments:  Omnia did not attend group.  [name], Tessa Seaberry A 10/11/2014, 10:22 PM

## 2014-10-11 NOTE — ED Notes (Signed)
Resting quietly with eye closed. Easily arousable. Verbally responsive. Resp even and unlabored. ABC's intact. NAD noted. No behavior problems noted.   

## 2014-10-11 NOTE — ED Notes (Signed)
Pt. To [facility] from ED ambulatory without difficulty, to room 37. Report from [facility] RN. Pt. Is alert and oriented, warm and dry in no distress. Pt. Denies SI, HI, and AVH. Pt. Calm and cooperative. Pt. Made aware of security cameras and Q15 minute rounds. Pt. Encouraged to let Nursing staff know of any concerns or needs.

## 2014-10-11 NOTE — ED Notes (Signed)
[facility]  Patient is a prior patient of Dr. Adela Glimpseabos.  Patient states she had a conflict with her brother causing her to become very angry.  Patient had destroyed some items in her home.  She report anxiety attacks and increasing depression.  She reports decreased sleep, decreased appetite with weight loss, crying spells.  She report prior suicide attempts.  States "she wants to die" with no specific plan. [facility].

## 2014-10-11 NOTE — B[facility] Counselor (Signed)
Binnie RailJoann Glover, [facility] at [facility], confirms adult unit is currently at capacity. Contacted the following facilities for placement:  BED AVAILABLE, FAXED CLINICAL INFORMATION: [facility]  AT CAPACITY: [facility], per [facility], per [facility], per [facility], per Wel[facility] Fl[facility], per [facility], per [employer]rystal Alvia GroveBrynn Marr, per [facility], per [facility], per [facility], per [facility], per [facility], per [facility], per [facility], per [facility] [place], per [facility], per [facility], per [facility], per Orson SlickJustin  NO RESPONSE: [facility] [address]   Jaleen Finch Ellis Patsy BaltimoreWarrick Jr, WisconsinLPC, [facility] Triage Specialist [phone]

## 2014-10-11 NOTE — ED Notes (Addendum)
Pt moved to [facility] ED room 37. Report called and given to Jillyn HiddenGary, CaliforniaRN

## 2014-10-11 NOTE — B[facility] Counselor (Signed)
Received call from Darl PikesSusan at [facility] saying they are not accepting any patients today.  Harlin RainFord Ellis Ria CommentWarrick Jr, LPC, [facility] Triage Specialist [phone]

## 2014-10-11 NOTE — BH Assessment (Signed)
[facility] Assessment Progress Note  Per Thedore MinsMojeed Akintayo, MD, pt requires psychiatric hospitalization.  He requests that this writer contact Nehemiah MassedFernando Cobos, MD at [facility] to see about admitting pt to his service, given that she has been a patient of his in the past.  Dr Jama Flavorsobos is agreeable to accepting her once a bed becomes available.  Per Berneice Heinrichina Tate, RN, [facility], pt has been assigned to Rm [id number].  Pt has signed Voluntary Admission and Consent for Treatment, as well as Consent to Release Information to Dr Florencia ReasonsMary Lundy, and signed forms have been [facility].  Pt's nurse, Rayfield CitizenCaroline, has been notified.  She agrees to send original documents along with pt via Juel Burrowelham, and to call report to [phone].  Doylene Canninghomas Brigid Vandekamp, MA Triage Specialist 10/11/2014 @ 16:34

## 2014-10-11 NOTE — Consult Note (Signed)
[facility] Face-to-Face Psychiatry Consult   Reason for Consult: Major depression Referring Physician:  EDP Patient Identification: Lacye Mccarn MRN:  [id number] Principal Diagnosis: MDD (major depressive disorder), recurrent episode, severe Diagnosis:   Patient Active Problem List   Diagnosis Date Noted  . MDD (major depressive disorder), recurrent episode, severe [F33.2] 02/19/2014    Priority: High    Total Time spent with patient: 45 minutes  Subjective:   Rini Moffit is a 49 y.o. female patient admitted with major depression, recurrent severe, hx of Bipolar 1 disorder.  HPI:  Caucasian female, 49 years old was assessed this morning seeking treatment for Depression and anxiety.  Patient reported increased stress related to her argument with her brother who came to visit.  Patient reported that her brother does not care about her and is not supportive.  Patient stated that was angry, anxious and had a brief panic attack.   She rated her depression 9/10 with 10 being severe depression.  Patient admitted to several suicide attempts in the past.  She denies SI/HI/AVH.  Patient has had therapy in the past and sees a Psychiatrist.  Patient reports that she is compliant with her medications.  She reported good sleep and fair appetite stating that she want to lose some weight.  We have accepted patient for admission and will be looking for bed at any facility with available bed.    HPI Elements:   Location:  Major depressive disorder, recurrent severe, hx Bipolar 1 disorder, Anxiety disorder. Quality:  Anger, anxiety, feelings of hopelessness, . Severity:  severe. Timing:  Acute, after an argument with sibling. Duration:  Chronic mental illness. Context:  Brought self in seeking treatment for depresession and anger..  Past Medical History:  Past Medical History  Diagnosis Date  . Depression   . Anxiety     Past Surgical History  Procedure Laterality Date  . Gallbladder surgery      Family History: History reviewed. No pertinent family history. Social History:  History  Alcohol Use No     History  Drug Use No    History   Social History  . Marital Status: Single    Spouse Name: N/A    Number of Children: N/A  . Years of Education: N/A   Social History Main Topics  . Smoking status: Never Smoker   . Smokeless tobacco: Never Used  . Alcohol Use: No  . Drug Use: No  . Sexual Activity: No   Other Topics Concern  . None   Social History Narrative   Additional Social History:    Pain Medications: Denies abuse Prescriptions: Denies abuse Over the Counter: Denies abuse History of alcohol / drug use?: No history of alcohol / drug abuse Longest period of sobriety (when/how long): NA   Allergies:   Allergies  Allergen Reactions  . Ambien [Zolpidem Tartrate] Other (See Comments)    jittery  . Haldol [Haloperidol Lactate]   . Lunesta [Eszopiclone]     Jittery   . Promethazine     jittery  . Trazodone And Nefazodone Other (See Comments)    Migraines     Vitals: Blood pressure 119/70, pulse 62, temperature 98.6 F (37 C), temperature source Oral, resp. rate 16, last menstrual period 09/19/2014, SpO2 99 %.  Risk to Self: Suicidal Ideation: Yes-Currently Present Suicidal Intent: No Is patient at risk for suicide?: Yes Suicidal Plan?: No Access to Means: No What has been your use of drugs/alcohol within the last 12 months?: Pt denies How many  times?: 5 Other Self Harm Risks: Pt has history of cutting and bruising herself Triggers for Past Attempts: Family contact, Other personal contacts Intentional Self Injurious Behavior: Cutting, Bruising Comment - Self Injurious Behavior: Pt has a history of cutting and bruising herself Risk to Others: Homicidal Ideation: No Thoughts of Harm to Others: No Current Homicidal Intent: No Current Homicidal Plan: No Access to Homicidal Means: No Identified Victim: None History of harm to others?:  No Assessment of Violence: On admission Violent Behavior Description: Pt destroyed property at her home Does patient have access to weapons?: No Criminal Charges Pending?: No Does patient have a court date: No Prior Inpatient Therapy: Prior Inpatient Therapy: Yes Prior Therapy Dates: 02/2014; seven other hospitalizations [facility] Reason for Treatment: Depression Prior Outpatient Therapy: Prior Outpatient Therapy: Yes Prior Therapy Dates: Current Prior Therapy Facilty/Provider(s): [facility] Reason for Treatment: Depression  Current Facility-Administered Medications  Medication Dose Route Frequency Provider Last Rate Last Dose  . acetaminophen (TYLENOL) tablet 650 mg  650 mg Oral Q4H PRN Antony MaduraKelly Humes, PA-C      . alum & mag hydroxide-simeth (MAALOX/MYLANTA) 200-200-20 MG/5ML suspension 30 mL  30 mL Oral PRN Antony MaduraKelly Humes, PA-C      . fluvoxaMINE (LUVOX) tablet 100 mg  100 mg Oral QHS Ronell Duffus      . LORazepam (ATIVAN) tablet 1 mg  1 mg Oral Q8H PRN Antony MaduraKelly Humes, PA-C      . nicotine (NICODERM CQ - dosed in mg/24 hours) patch 21 mg  21 mg Transdermal Daily Antony MaduraKelly Humes, PA-C   21 mg at 10/11/14 1047  . ondansetron (ZOFRAN) tablet 4 mg  4 mg Oral Q8H PRN Antony MaduraKelly Humes, PA-C       Current Outpatient Prescriptions  Medication Sig Dispense Refill  . ALPRAZolam (XANAX) 0.5 MG tablet Take 2 mg by mouth 2 (two) times daily as needed for anxiety (anxiety).    Marland Kitchen. diphenhydrAMINE (BENADRYL) 25 MG tablet Take 25-50 mg by mouth every 6 (six) hours as needed for itching (itching).    . fluvoxaMINE (LUVOX) 100 MG tablet Take 100 mg by mouth at bedtime.    Marland Kitchen. LORazepam (ATIVAN) 0.5 MG tablet Take 0.5 mg by mouth at bedtime.    . Melatonin 3 MG TABS Take 1 tablet by mouth at bedtime.    . Melatonin 5 MG TABS Take 1 tablet by mouth at bedtime.    . polyethylene glycol (MIRALAX / GLYCOLAX) packet Take 17 g by mouth daily as needed for mild  constipation (constipation).    . Tetrahydrozoline HCl (EYE DROPS OP) Place 2 drops into both eyes 3 (three) times daily as needed (itchy eyes).    . ARIPiprazole (ABILIFY) 2 MG tablet Take 1 tablet (2 mg total) by mouth at bedtime. (Patient not taking: Reported on 10/10/2014) 30 tablet 0  . busPIRone (BUSPAR) 5 MG tablet Take 1 tablet (5 mg total) by mouth 2 (two) times daily. (Patient not taking: Reported on 10/10/2014) 60 tablet 0  . escitalopram (LEXAPRO) 5 MG tablet Take 1 tablet (5 mg total) by mouth daily. (Patient not taking: Reported on 10/10/2014) 30 tablet 0  . hydrOXYzine (ATARAX/VISTARIL) 50 MG tablet Take 1 tablet (50 mg total) by mouth at bedtime. (Patient not taking: Reported on 10/10/2014) 14 tablet 0  . lithium carbonate 150 MG capsule Take 1 capsule (150 mg total) by mouth 2 (two) times daily with a meal. (Patient not taking: Reported on 10/10/2014) 60 capsule  0  . norethindrone-ethinyl estradiol (MICROGESTIN) 1-20 MG-MCG tablet Take 1 tablet by mouth daily. (Patient not taking: Reported on 10/10/2014) 1 Package 11    Musculoskeletal: Strength & Muscle Tone: within normal limits Gait & Station: normal Patient leans: N/A  Psychiatric Specialty Exam:     Blood pressure 119/70, pulse 62, temperature 98.6 F (37 C), temperature source Oral, resp. rate 16, last menstrual period 09/19/2014, SpO2 99 %.There is no weight on file to calculate BMI.  General Appearance: Casual and Fairly Groomed  Patent attorney::  Good  Speech:  Clear and Coherent  Volume:  Normal  Mood:  Depressed  Affect:  Congruent and Depressed  Thought Process:  Coherent, Goal Directed and Intact  Orientation:  Full (Time, Place, and Person)  Thought Content:  WDL  Suicidal Thoughts:  No  Homicidal Thoughts:  No  Memory:  Immediate;   Good Recent;   Good Remote;   Good  Judgement:  Good  Insight:  Good  Psychomotor Activity:  Normal  Concentration:  Good  Recall:  NA  Fund of Knowledge:Good  Language: Good   Akathisia:  NA  Handed:  Right  AIMS (if indicated):     Assets:  Desire for Improvement  ADL's:  Intact  Cognition: WNL  Sleep:      Medical Decision Making: Established Problem, Worsening (2), Review of Medication Regimen & Side Effects (2) and Review of New Medication or Change in Dosage (2)  Treatment Plan Summary: Daily contact with patient to assess and evaluate symptoms and progress in treatment, Medication management and Plan Accepted for admission  Plan:  Recommend psychiatric Inpatient admission when medically cleared. Disposition: Bobbye Charleston 10/11/2014 12:55 PM  Patient seen, evaluated and I agree with notes by Nurse Practitioner. Thedore Mins, MD

## 2014-10-11 NOTE — ED Notes (Signed)
[facility]. Personal items removed, bagged, labeled, and placed at NS. Personal items removed: 3 shirts, 2 pants, 2-shoes/slippers, 17 bras/underwear, 4 set of pj,1 black robe and 1 overnight bag.

## 2014-10-12 DIAGNOSIS — F329 Major depressive disorder, single episode, unspecified: Secondary | ICD-10-CM

## 2014-10-12 DIAGNOSIS — R45851 Suicidal ideations: Secondary | ICD-10-CM

## 2014-10-12 MED ORDER — LORAZEPAM 0.5 MG PO TABS
0.5000 mg | ORAL_TABLET | Freq: Three times a day (TID) | ORAL | Status: DC
  Administered 2014-10-12 – 2014-10-14 (×6): 0.5 mg via ORAL
  Filled 2014-10-12 (×6): qty 1

## 2014-10-12 NOTE — H&P (Signed)
Psychiatric Admission Assessment Adult  Patient Identification: Stephanie Lowery MRN:  [id number] Date of Evaluation:  10/12/2014 Chief Complaint:  " I exploded "  Principal Diagnosis: Depression Diagnosis:   Patient Active Problem List   Diagnosis Date Noted  . MDD (major depressive disorder), recurrent severe, without psychosis [F33.2] 10/11/2014  . MDD (major depressive disorder), recurrent episode, severe [F33.2] 02/19/2014   History of Present Illness::  Patient is a 49 year old female, known to me from past outpatient treatment. She has a history of  Chronic Mood Disorder, Depression. She normally lives with her parents, who are her closest support network.  She had recently been more upset because she purchased an expensive computer that broke down soon after she bought it, and she was unable to return it, and felt store where she purchased it was unhelpful.  She also has historically  had a difficult relationship with her older brother. In the context of ongoing conflict with her brother, who recently visited(  Normally he lives in [place])  she states she initially locked herself in the closet, but later  became acutely agitated and hit a wall,  And threw some items at home. She was then brought to ED by her parents. Patient did make statements she wanted to die, resulting in admission. ( Patient has chronic passive thoughts of death) .  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 Today patient is improved , although she remains depressed , tearful when discussing her stressors,and at this time presents calm, cooperative, not agitated . Regarding medications, she states that she has been on Luvox over recent weeks, and states that this medication does seem to help more than previous.   Elements:  Long history of mood disorder, anxiety, recently  Worsening depression and anxiety in the context of family stressors. Associated  Signs/Symptoms: Depression Symptoms:  depressed mood, anhedonia, feelings of worthlessness/guilt, recurrent thoughts of death, anxiety, panic attacks, loss of energy/fatigue, disturbed sleep, (Hypo) Manic Symptoms:  none  Anxiety Symptoms:  History of anxiety, history of panic attacks Psychotic Symptoms:  none  PTSD Symptoms: At this time does not endorse  Total Time spent with patient: 45 minutes  Past Medical History:  Hyper cholesterolemia.  Psychiatric history is remarkable for chronic depression, panic attacks, history of prior admissions due to depression. Last admission was June 2015.  Outpatient psychiatrist is Dr.Lunde, at [facility] it [place], [place]. Also has a therapist, Britt Bolognese, but has not seen her recently.  Past Medical History  Diagnosis Date  . Depression   . Anxiety     Past Surgical History  Procedure Laterality Date  . Gallbladder surgery     Family History: Parents alive , live together, very supportive, one older brother, lives out of state. History of  Depression and suicide in mother's extended family. 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  History  Alcohol Use No     History  Drug Use No    History   Social History  . Marital Status: Single    Spouse Name: N/A    Number of Children: N/A  . Years of Education: N/A   Social History Main Topics  . Smoking status: Never Smoker   . Smokeless tobacco: Never Used  . Alcohol Use: No  . Drug Use: No  . Sexual Activity: No   Other Topics Concern  . None  Social History Narrative   Additional Social History:    Pain Medications: none Prescriptions: none Longest period of sobriety (when/how long): none   Musculoskeletal: Strength & Muscle Tone: within normal limits Gait & Station: normal Patient leans: N/A  Psychiatric Specialty Exam: Physical Exam  Review of  Systems  Constitutional: Negative for fever and chills.  Respiratory: Negative for cough.   Cardiovascular: Negative for chest pain.  Gastrointestinal: Negative for vomiting.  Genitourinary: Negative for dysuria, urgency and frequency.  Musculoskeletal: Negative.        Hand pain related to recently hitting her hand against a wall or similar   Neurological: Negative for headaches.  Psychiatric/Behavioral: Positive for depression and suicidal ideas.    Blood pressure 109/79, pulse 83, temperature 98.1 F (36.7 C), temperature source Oral, resp. rate 16, height 5' 2.25" (1.581 m), weight 146 lb (66.225 kg), last menstrual period 09/19/2014.Body mass index is 26.49 kg/(m^2).  General Appearance: fairly groomed   Engineer, water::  Good  Speech:  Normal Rate  Volume:  Normal  Mood:  Depressed  Affect:  Constricted and but reactive  Thought Process:  Goal Directed and Linear  Orientation:  Full (Time, Place, and Person)  Thought Content:  Rumination and no hallucinations, no delusions, ruminative about family stressors, particularly regarding relationship with brother  Suicidal Thoughts:  Yes.  without intent/plan at this time denies any plan or intention of hurting self   Homicidal Thoughts:  No  Memory:  Recent and remote grossly intact   Judgement:  Fair  Insight:  Present  Psychomotor Activity:  Normal  Concentration:  Good  Recall:  Good  Fund of Knowledge:Good  Language: Good  Akathisia:  Negative  Handed:  Right  AIMS (if indicated):     Assets:  Communication Skills Desire for Improvement Resilience Social Support  ADL's: fair   Cognition: oriented x 3, fully alert .  Sleep:  Number of Hours: 5.5   Risk to Self: Is patient at risk for suicide?: No What has been your use of drugs/alcohol within the last 12 months?: None, denies use Risk to Others:   Prior Inpatient Therapy:   Prior Outpatient Therapy:    Alcohol Screening: 1. How often do you have a drink containing  alcohol?: Never 9. Have you or someone else been injured as a result of your drinking?: No 10. Has a relative or friend or a doctor or another health worker been concerned about your drinking or suggested you cut down?: No Alcohol Use Disorder Identification Test Final Score (AUDIT): 0 Brief Intervention: Patient declined brief intervention  Allergies:   Allergies  Allergen Reactions  . Ambien [Zolpidem Tartrate] Other (See Comments)    jittery  . Haldol [Haloperidol Lactate]   . Lunesta [Eszopiclone]     Jittery   . Promethazine     jittery  . Trazodone And Nefazodone Other (See Comments)    Migraines    Lab Results:  Results for orders placed or performed during the hospital encounter of 10/10/14 (from the past 48 hour(s))  Pregnancy, urine     Status: None   Collection Time: 10/10/14 11:07 PM  Result Value Ref Range   Preg Test, Ur NEGATIVE NEGATIVE    Comment:        THE SENSITIVITY OF THIS METHODOLOGY IS >20 mIU/mL.   Urine rapid drug screen (hosp performed)     Status: Abnormal   Collection Time: 10/10/14 11:07 PM  Result Value Ref Range   Opiates NONE DETECTED NONE  DETECTED   Cocaine NONE DETECTED NONE DETECTED   Benzodiazepines POSITIVE (A) NONE DETECTED   Amphetamines NONE DETECTED NONE DETECTED   Tetrahydrocannabinol NONE DETECTED NONE DETECTED   Barbiturates NONE DETECTED NONE DETECTED    Comment:        DRUG SCREEN FOR MEDICAL PURPOSES ONLY.  IF CONFIRMATION IS NEEDED FOR ANY PURPOSE, NOTIFY LAB WITHIN 5 DAYS.        LOWEST DETECTABLE LIMITS FOR URINE DRUG SCREEN Drug Class       Cutoff (ng/mL) Amphetamine      1000 Barbiturate      200 Benzodiazepine   470 Tricyclics       962 Opiates          300 Cocaine          300 THC              50   CBC with Differential     Status: Abnormal   Collection Time: 10/10/14 11:35 PM  Result Value Ref Range   WBC 7.9 4.0 - 10.5 K/uL   RBC 3.99 3.87 - 5.11 MIL/uL   Hemoglobin 13.7 12.0 - 15.0 g/dL   HCT 40.7  36.0 - 46.0 %   MCV 102.0 (H) 78.0 - 100.0 fL   MCH 34.3 (H) 26.0 - 34.0 pg   MCHC 33.7 30.0 - 36.0 g/dL   RDW 11.6 11.5 - 15.5 %   Platelets 292 150 - 400 K/uL   Neutrophils Relative % 57 43 - 77 %   Neutro Abs 4.5 1.7 - 7.7 K/uL   Lymphocytes Relative 29 12 - 46 %   Lymphs Abs 2.3 0.7 - 4.0 K/uL   Monocytes Relative 6 3 - 12 %   Monocytes Absolute 0.5 0.1 - 1.0 K/uL   Eosinophils Relative 7 (H) 0 - 5 %   Eosinophils Absolute 0.5 0.0 - 0.7 K/uL   Basophils Relative 1 0 - 1 %   Basophils Absolute 0.0 0.0 - 0.1 K/uL  Comprehensive metabolic panel     Status: Abnormal   Collection Time: 10/10/14 11:35 PM  Result Value Ref Range   Sodium 136 135 - 145 mmol/L   Potassium 4.0 3.5 - 5.1 mmol/L   Chloride 104 96 - 112 mmol/L   CO2 28 19 - 32 mmol/L   Glucose, Bld 98 70 - 99 mg/dL   BUN 13 6 - 23 mg/dL   Creatinine, Ser 0.96 0.50 - 1.10 mg/dL   Calcium 8.7 8.4 - 10.5 mg/dL   Total Protein 6.7 6.0 - 8.3 g/dL   Albumin 3.7 3.5 - 5.2 g/dL   AST 21 0 - 37 U/L   ALT 15 0 - 35 U/L   Alkaline Phosphatase 85 39 - 117 U/L   Total Bilirubin 0.5 0.3 - 1.2 mg/dL   GFR calc non Af Amer 69 (L) >90 mL/min   GFR calc Af Amer 80 (L) >90 mL/min    Comment: (NOTE) The eGFR has been calculated using the CKD EPI equation. This calculation has not been validated in all clinical situations. eGFR's persistently <90 mL/min signify possible Chronic Kidney Disease.    Anion gap 4 (L) 5 - 15  Ethanol     Status: None   Collection Time: 10/10/14 11:35 PM  Result Value Ref Range   Alcohol, Ethyl (B) <5 0 - 9 mg/dL    Comment:        LOWEST DETECTABLE LIMIT FOR SERUM ALCOHOL IS 11 mg/dL FOR MEDICAL PURPOSES  ONLY    Current Medications: Current Facility-Administered Medications  Medication Dose Route Frequency Provider Last Rate Last Dose  . ALPRAZolam Duanne Moron) tablet 2 mg  2 mg Oral BID PRN Laverle Hobby, PA-C      . diphenhydrAMINE (BENADRYL) tablet 25-50 mg  25-50 mg Oral Q6H PRN Laverle Hobby,  PA-C      . diphenhydrAMINE-zinc acetate (BENADRYL) 9-5.6 % cream 1 application  1 application Topical TID PRN Delfin Gant, NP   1 application at 38/75/64 [phone]  . fluvoxaMINE (LUVOX) tablet 100 mg  100 mg Oral QHS Laverle Hobby, PA-C   100 mg at 10/11/14 2156  . polyethylene glycol (MIRALAX / GLYCOLAX) packet 17 g  17 g Oral Daily PRN Laverle Hobby, PA-C   17 g at 10/12/14 0843  . temazepam (RESTORIL) capsule 30 mg  30 mg Oral QHS PRN Laverle Hobby, PA-C   30 mg at 10/11/14 2156  . tetrahydrozoline 0.05 % ophthalmic solution 2 drop  2 drop Both Eyes TID PRN Laverle Hobby, PA-C       PTA Medications: See below- please note that patient had stopped Lithium, Abilify, Buspar. Only medication she was taking recently was Ativan 2 mgrs QHS , Luvox 100 mgrs QDAY  .  Prescriptions prior to admission  Medication Sig Dispense Refill Last Dose  . ALPRAZolam (XANAX) 0.5 MG tablet Take 2 mg by mouth 2 (two) times daily as needed for anxiety (anxiety).   10/10/2014 at Unknown time  . diphenhydrAMINE (BENADRYL) 25 MG tablet Take 25-50 mg by mouth every 6 (six) hours as needed for itching (itching).   10/10/2014 at Unknown time  . fluvoxaMINE (LUVOX) 100 MG tablet Take 100 mg by mouth at bedtime.   Past Week at Unknown time  . polyethylene glycol (MIRALAX / GLYCOLAX) packet Take 17 g by mouth daily as needed for mild constipation (constipation).   Past Week at Unknown time  . Tetrahydrozoline HCl (EYE DROPS OP) Place 2 drops into both eyes 3 (three) times daily as needed (itchy eyes).   Past Week at Unknown time  . ARIPiprazole (ABILIFY) 2 MG tablet Take 1 tablet (2 mg total) by mouth at bedtime. (Patient not taking: Reported on 10/10/2014) 30 tablet 0 More than a month at Unknown time  . busPIRone (BUSPAR) 5 MG tablet Take 1 tablet (5 mg total) by mouth 2 (two) times daily. (Patient not taking: Reported on 10/10/2014) 60 tablet 0 Unknown at Unknown time  . escitalopram (LEXAPRO) 5 MG tablet Take 1 tablet  (5 mg total) by mouth daily. (Patient not taking: Reported on 10/10/2014) 30 tablet 0 More than a month at Unknown time  . hydrOXYzine (ATARAX/VISTARIL) 50 MG tablet Take 1 tablet (50 mg total) by mouth at bedtime. (Patient not taking: Reported on 10/10/2014) 14 tablet 0 Unknown at Unknown time  . lithium carbonate 150 MG capsule Take 1 capsule (150 mg total) by mouth 2 (two) times daily with a meal. (Patient not taking: Reported on 10/10/2014) 60 capsule 0 Unknown at Unknown time  . LORazepam (ATIVAN) 0.5 MG tablet Take 0.5 mg by mouth at bedtime.   10/09/2014 at Unknown time  . Melatonin 3 MG TABS Take 1 tablet by mouth at bedtime.   10/09/2014 at Unknown time  . Melatonin 5 MG TABS Take 1 tablet by mouth at bedtime.   10/09/2014 at Unknown time  . norethindrone-ethinyl estradiol (MICROGESTIN) 1-20 MG-MCG tablet Take 1 tablet by mouth daily. (Patient not taking:  Reported on 10/10/2014) 1 Package 11 Unknown at Unknown time    Previous Psychotropic Medications: yes   Substance Abuse History in the last 12 months:  No.    Consequences of Substance Abuse: Negative  Results for orders placed or performed during the hospital encounter of 10/10/14 (from the past 72 hour(s))  Pregnancy, urine     Status: None   Collection Time: 10/10/14 11:07 PM  Result Value Ref Range   Preg Test, Ur NEGATIVE NEGATIVE    Comment:        THE SENSITIVITY OF THIS METHODOLOGY IS >20 mIU/mL.   Urine rapid drug screen (hosp performed)     Status: Abnormal   Collection Time: 10/10/14 11:07 PM  Result Value Ref Range   Opiates NONE DETECTED NONE DETECTED   Cocaine NONE DETECTED NONE DETECTED   Benzodiazepines POSITIVE (A) NONE DETECTED   Amphetamines NONE DETECTED NONE DETECTED   Tetrahydrocannabinol NONE DETECTED NONE DETECTED   Barbiturates NONE DETECTED NONE DETECTED    Comment:        DRUG SCREEN FOR MEDICAL PURPOSES ONLY.  IF CONFIRMATION IS NEEDED FOR ANY PURPOSE, NOTIFY LAB WITHIN 5 DAYS.        LOWEST  DETECTABLE LIMITS FOR URINE DRUG SCREEN Drug Class       Cutoff (ng/mL) Amphetamine      1000 Barbiturate      200 Benzodiazepine   654 Tricyclics       650 Opiates          300 Cocaine          300 THC              50   CBC with Differential     Status: Abnormal   Collection Time: 10/10/14 11:35 PM  Result Value Ref Range   WBC 7.9 4.0 - 10.5 K/uL   RBC 3.99 3.87 - 5.11 MIL/uL   Hemoglobin 13.7 12.0 - 15.0 g/dL   HCT 40.7 36.0 - 46.0 %   MCV 102.0 (H) 78.0 - 100.0 fL   MCH 34.3 (H) 26.0 - 34.0 pg   MCHC 33.7 30.0 - 36.0 g/dL   RDW 11.6 11.5 - 15.5 %   Platelets 292 150 - 400 K/uL   Neutrophils Relative % 57 43 - 77 %   Neutro Abs 4.5 1.7 - 7.7 K/uL   Lymphocytes Relative 29 12 - 46 %   Lymphs Abs 2.3 0.7 - 4.0 K/uL   Monocytes Relative 6 3 - 12 %   Monocytes Absolute 0.5 0.1 - 1.0 K/uL   Eosinophils Relative 7 (H) 0 - 5 %   Eosinophils Absolute 0.5 0.0 - 0.7 K/uL   Basophils Relative 1 0 - 1 %   Basophils Absolute 0.0 0.0 - 0.1 K/uL  Comprehensive metabolic panel     Status: Abnormal   Collection Time: 10/10/14 11:35 PM  Result Value Ref Range   Sodium 136 135 - 145 mmol/L   Potassium 4.0 3.5 - 5.1 mmol/L   Chloride 104 96 - 112 mmol/L   CO2 28 19 - 32 mmol/L   Glucose, Bld 98 70 - 99 mg/dL   BUN 13 6 - 23 mg/dL   Creatinine, Ser 0.96 0.50 - 1.10 mg/dL   Calcium 8.7 8.4 - 10.5 mg/dL   Total Protein 6.7 6.0 - 8.3 g/dL   Albumin 3.7 3.5 - 5.2 g/dL   AST 21 0 - 37 U/L   ALT 15 0 - 35 U/L  Alkaline Phosphatase 85 39 - 117 U/L   Total Bilirubin 0.5 0.3 - 1.2 mg/dL   GFR calc non Af Amer 69 (L) >90 mL/min   GFR calc Af Amer 80 (L) >90 mL/min    Comment: (NOTE) The eGFR has been calculated using the CKD EPI equation. This calculation has not been validated in all clinical situations. eGFR's persistently <90 mL/min signify possible Chronic Kidney Disease.    Anion gap 4 (L) 5 - 15  Ethanol     Status: None   Collection Time: 10/10/14 11:35 PM  Result Value Ref  Range   Alcohol, Ethyl (B) <5 0 - 9 mg/dL    Comment:        LOWEST DETECTABLE LIMIT FOR SERUM ALCOHOL IS 11 mg/dL FOR MEDICAL PURPOSES ONLY     Observation Level/Precautions:  15 minute checks  Laboratory:  if needed   Psychotherapy:  Milieu, supportive group therapy  Medications:  Will continue Ativan 0.5 mgrs TID, and Luvox 100 mgrs QDAY   Consultations:  If needed   Discharge Concerns:  Plans to return home after discharge   Estimated LOS: 4 days   Other:     Psychological Evaluations: NO   Treatment Plan Summary: Daily contact with patient to assess and evaluate symptoms and progress in treatment, Medication management, Plan Admit to inpatient unit and Medications as below  Medical Decision Making:  Review of Psycho-Social Stressors (1), Review or order clinical lab tests (1), Established Problem, Worsening (2) and Review of Medication Regimen & Side Effects (2)  I certify that inpatient services furnished can reasonably be expected to improve the patient's condition.   Neita Garnet 2/2/20166:23 PM

## 2014-10-12 NOTE — Progress Notes (Signed)
Patient ID: Stephanie Lowery, female   DOB: [birth year], 49 y.o.   MRN: [id number] Received report from Madelaine BhatAdam, RN. Admit searched and brought back to unit, admission needs to be completed.

## 2014-10-12 NOTE — [facility] Group Notes (Signed)
[facility] 0900 Nursing Group  The focus of this group is to educate the patient on the purpose and policies of crisis stabilization and provide a format to answer questions about their admission.  The group details unit policies and expectations of patients while admitted.  Patient was invited to group and attended. Patient was engaged in group and did appear to be thinking of a question to ask however she did not.

## 2014-10-12 NOTE — Tx Team (Signed)
Interdisciplinary Treatment Plan Update (Adult) Date: 10/12/2014   Time Reviewed: 9:30 AM  Progress in Treatment: Attending groups: Continuing to assess, patient new to milieu. Participating in groups: Continuing to assess, patient new to milieu. Taking medication as prescribed: Yes Tolerating medication: Yes Family/Significant other contact made: No, CSW assessing for appropriate contacts. Patient understands diagnosis: Yes Discussing patient identified problems/goals with staff: Yes Medical problems stabilized or resolved: Yes Denies suicidal/homicidal ideation: Yes Issues/concerns per patient self-inventory: Yes Other:  New problem(s) identified: N/A  Discharge Plan or Barriers:   2/2: CSW continuing to assess, patient new to milieu.  Reason for Continuation of Hospitalization:  Depression Anxiety Medication Stabilization   Comments: N/A  Estimated length of stay: 3-5 days  For review of initial/current patient goals, please see plan of care. Patient is a 49 year old female admitted with increased anger and depression following an argument with her brother. Patient will benefit from crisis stabilization, medication evaluation, group therapy, and psycho education in addition to case management for discharge planning. Patient and CSW reviewed pt's identified goals and treatment plan. Pt verbalized understanding and agreed to treatment plan.   Attendees: Patient:    Family:    Physician: Dr. Jama Flavorsobos; Dr. Dub MikesLugo 10/12/2014 9:30 AM  Nursing:Beverly Terrilee CroakKnight; Waynetta SandyJan Wright; SkidmorePatrice White, RN 10/12/2014 9:30 AM  Clinical Social Worker: Belenda CruiseKristin Patsy Zaragoza,  LCSWA 10/12/2014 9:30 AM  Other: Juline PatchQuylle Hodnett, LCSW 10/12/2014 9:30 AM  Other: [name], Vesta MixerMonarch Liaison 10/12/2014 9:30 AM  Other: [name], Case Manager 10/12/2014 9:30 AM  Other: Serena ColonelAggie Nwoko, NP 10/12/2014 9:30 AM  Other:  Trula SladeHeather Smart, LCSWA 10/12/2014 9:30 AM  Other:    Other:    Other:    Other:       Scribe for Treatment  Team:  Samuella BruinKristin Jiyan Walkowski, MSW, [employer] IncLCSWA [phone]

## 2014-10-12 NOTE — Progress Notes (Signed)
Patient ID: Stephanie Lowery, female   DOB: 12-Sep-1965, 49 y.o.   MRN: [id number]  DAR: Pt. Denies SI/HI and A/V Hallucinations to this Clinical research associatewriter. Patient does not report any pain or discomfort at this time. Support and encouragement provided to the patient. No scheduled medications at this time but patient received PRN Miralax and cream for itching this morning. Patient is receptive and cooperative but minimal with Clinical research associatewriter. Patient is seen in the milieu attending groups and is seen speaking with peers. Q15 minute checks are maintained for safety.

## 2014-10-12 NOTE — [facility] Suicide Risk Assessment (Signed)
[facility]   Nursing information obtained from:  Patient Demographic factors:  Caucasian, Living alone Current Mental Status:  NA Loss Factors:  Financial problems / change in socioeconomic status Historical Factors:  Prior suicide attempts, Family history of suicide, Family history of mental illness or substance abuse, Impulsivity Risk Reduction Factors:  Sense of responsibility to family, Religious beliefs about death, Living with another person, especially a relative Total Time spent with patient: 45 minutes Principal Problem: depression, impulsivity Diagnosis:   Patient Active Problem List   Diagnosis Date Noted  . MDD (major depressive disorder), recurrent severe, without psychosis [F33.2] 10/11/2014  . MDD (major depressive disorder), recurrent episode, severe [F33.2] 02/19/2014     Continued Clinical Symptoms:  Alcohol Use Disorder Identification Test Final Score (AUDIT): 0 The "Alcohol Use Disorders Identification Test", Guidelines for Use in Primary Care, Second Edition.  World Science writerHealth Organization [facility](WHO). Score between 0-7:  no or low risk or alcohol related problems. Score between 8-15:  moderate risk of alcohol related problems. Score between 16-19:  high risk of alcohol related problems. Score 20 or above:  warrants further diagnostic evaluation for alcohol dependence and treatment.   CLINICAL FACTORS:  Worsening depression, anxiety, and recent explosive outburst in the context of family stressors.   Musculoskeletal: Strength & Muscle Tone: within normal limits Gait & Station: normal Patient leans: N/A  Psychiatric Specialty Exam: Physical Exam  ROS  Blood pressure 109/79, pulse 83, temperature 98.1 F (36.7 C), temperature source Oral, resp. rate 16, height 5' 2.25" (1.581 m), weight 146 lb (66.225 kg), last menstrual period 09/19/2014.Body mass index is 26.49 kg/(m^2).  SEE ADMIT NOTE MSE                                                         COGNITIVE FEATURES THAT CONTRIBUTE TO RISK:  Closed-mindedness    SUICIDE RISK:   Moderate:  Frequent suicidal ideation with limited intensity, and duration, some specificity in terms of plans, no associated intent, good self-control, limited dysphoria/symptomatology, some risk factors present, and identifiable protective factors, including available and accessible social support.  PLAN OF CARE: Patient will be admitted to inpatient psychiatric unit for stabilization and safety. Will provide and encourage milieu participation. Provide medication management and maked adjustments as needed.  Will follow daily.  Patient will be admitted to inpatient psychiatric unit for stabilization and safety. Will provide and encourage milieu participation. Provide medication management and maked adjustments as needed.  Will follow daily.    Medical Decision Making:  Review of Psycho-Social Stressors (1), Review or order clinical lab tests (1), Review of Last Therapy Session (1) and Review of Medication Regimen & Side Effects (2)  I certify that inpatient services furnished can reasonably be expected to improve the patient's condition.   Bonnetta Allbee, Madaline GuthrieFERNANDO 10/12/2014, 7:38 PM

## 2014-10-12 NOTE — [facility] Group Notes (Signed)
Adult Psychoeducational Group Note  Date:  10/12/2014 Time:  9:55 PM  Group Topic/Focus:  Wrap-Up Group:   The focus of this group is to help patients review their daily goal of treatment and discuss progress on daily workbooks.  Participation Level:  Active  Participation Quality:  Appropriate  Affect:  Appropriate  Cognitive:  Appropriate  Insight: Appropriate  Engagement in Group:  Engaged  Modes of Intervention:  Discussion  Additional Comments: Kayce stated her day was great.  She saw the doctor and is discharging on Friday.  Her goal was to be positive and keep the faith.  She also expressed "I got my mojo back".   [name], Itzell Bendavid A 10/12/2014, 9:55 PM

## 2014-10-12 NOTE — Progress Notes (Signed)
Patient ID: Stephanie Lowery, female   DOB: 11-Sep-1965, 49 y.o.   MRN: [id number] D: client visible on the unit, reports "I found a friend we been talking about the bible" also notes that she saw the doctor today and discussed her medications. A: Writer reviewed medications, administered as ordered. Provided emotional support encouraged client to attend group and focus on coping skills. Staff will monitor q6815min for safety. R: Client is safe on the unit, attended group.
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
## 2014-10-12 NOTE — Plan of Care (Signed)
Problem: Ineffective individual coping Goal: STG: Patient will remain free from self harm Outcome: Progressing Patient has remained free from self harm since admission as evidenced by Q15 minute safety checks.     

## 2014-10-13 DIAGNOSIS — F332 Major depressive disorder, recurrent severe without psychotic features: Principal | ICD-10-CM

## 2014-10-13 MED ORDER — FLUVOXAMINE MALEATE 50 MG PO TABS
50.0000 mg | ORAL_TABLET | Freq: Every day | ORAL | Status: DC
  Administered 2014-10-13 – 2014-10-14 (×2): 50 mg via ORAL
  Filled 2014-10-13 (×4): qty 1
  Filled 2014-10-13: qty 42

## 2014-10-13 NOTE — Progress Notes (Signed)
Patient ID: Stephanie Lowery, female   DOB: [birth year], 49 y.o.   MRN: [id number]  DAR: Pt. Denies SI/HI and A/V Hallucinations. Patient does not report any pain or discomfort at this time. Patient reports sleeping well, fair appetite, normal energy level, and good level of concentration. Patient rates depression 1/10, hopelessness 3/10, and anxiety 5/10. Support and encouragement provided to the patient. Scheduled medications administered to patient per physician's orders. Patient reported this morning she was unhappy because she did not have medication she wanted to have scheduled however after speaking with MD she is okay. Patient reports she is planning on leaving tomorrow. Patient is receptive and cooperative with Clinical research associatewriter but was slightly irritable this morning about her medication. However, patient reported later today she was doing better. Patient is seen in the milieu and is attending groups. Q15 minute checks are maintained for safety.

## 2014-10-13 NOTE — [facility] Group Notes (Signed)
[facility] LCSW Group Therapy 10/13/2014  1:15 PM Type of Therapy: Group Therapy Participation Level: Active  Participation Quality: Attentive, Sharing and Supportive  Affect: Appropriate  Cognitive: Alert and Oriented  Insight: Developing/Improving and Engaged  Engagement in Therapy: Developing/Improving and Engaged  Modes of Intervention: Clarification, Confrontation, Discussion, Education, Exploration, Limit-setting, Orientation, Problem-solving, Rapport Building, Dance movement psychotherapisteality Testing, Socialization and Support  Summary of Progress/Problems: The topic for group today was emotional regulation. This group focused on both positive and negative emotion identification and allowed group members to process ways to identify feelings, regulate negative emotions, and find healthy ways to manage internal/external emotions. Group members were asked to reflect on a time when their reaction to an emotion led to a negative outcome and explored how alternative responses using emotion regulation would have benefited them. Group members were also asked to discuss a time when emotion regulation was utilized when a negative emotion was experienced. Patient reports experiencing anxiety and discussed ways in which she can regulate anxiety including exercising, outpatient services, and interest in TMS therapy. CSW and other group members provided patient with emotional support and encouragement.  Samuella BruinKristin Weslie Pretlow, MSW, [employer]CSWA Clinical Social Worker [facility] [phone]

## 2014-10-13 NOTE — Plan of Care (Signed)
Problem: Diagnosis: Increased Risk For Suicide Attempt Goal: STG-Patient Will Report Suicidal Feelings to Staff Outcome: Progressing Client currently denies suicidal ideations, "I wouldn't do that, I'm to religious for that"

## 2014-10-13 NOTE — Progress Notes (Signed)
Recreation Therapy Notes  02.03.2016 @ 2:55pm per MD order LRT met with patient 1:1 to introduce stress management techniques to patient. LRT began session by asking patient if she had ever participated in any stress management interventions in the past. Patient indicated the only one she was familiar with was breathing into a paper bag. Patient identified she felt this technique was helpful because it helped her feel calm. Due to patient previous success with breathing techniques LRT introduced diaphragmatic breathing to patient. Patient receptive to technique, however needed assistance correctly practicing technique. Following introduction of diaphragmatic breathing and patient displaying she could accurately use technique LRT introduced Heart Math technique of coherence. Patient practiced coherence for approximately 1 minute. Patient identifying following each technique she noted a reduction in her stress level, describing it as feeling "calm and mellow."   Patient instructed to practice both techniques in small increments until she could work up to longer periods of time, diaphragmatic breathing in increments of 5, coherence in 30 second to 1 minute increments.   Patient indicated she is scheduled for d/c 02.04.2016, should her hospitalization be extended LRT will return to work with patient.   Laureen Ochs Kendon Sedeno, LRT/CTRS  Mollee Neer L 10/13/2014 3:08 PM

## 2014-10-13 NOTE — Progress Notes (Signed)
Chaplain consult with Beena d/t readmission.  Familiar with pt from admission in June of 2015.  Visited with pt in pt room on [address].  She is in good spirits.  Spoke with chaplain about continued tension in relationship with brother.  Was not specific about frustrations with brother, but feels they are "missing out on a lot" by not being able to relate as well as she hopes they would.  Speaking about how to find peace for herself in midst of tension in relationship.  Spoke with chaplain about difficulty in holding hope for relationship in tension with grief around reality of relationship.   Stated that she was fearing the death of her parents.  They are her primary support and she recognizes that they are ageing (Mother [id number], Father [id number]).  She worries that she will be alone after their passing.  She has been attending a church, but states that there are not many people there she connects with.   Values trust and [facility] and worries when she discharges home to [place], she will not be able to find similar groups.  Chaplain provided empathic presence and support.   Belva CromeStalnaker, Puanani Gene Wayne MDiv

## 2014-10-13 NOTE — Progress Notes (Signed)
Pt did not attend group this evening.  

## 2014-10-13 NOTE — Progress Notes (Signed)
[facility] MD Progress Note  10/13/2014 4:15 PM Stephanie Lowery  MRN:  [id number] Subjective:   Today patient is feeling " better", she states she is feeling more like her normal self, and is hoping to be discharged soon. Objective: Patient case discussed with treatment team and I have met with patient. She is improved compared to admission. Today presents with an improved mood and range of affect. She is brighter, and smiles often during session. She states she is tolerating medications well. She feels that the combination of Luvox and low dose BZD has been helpful. As discussed, we have agreed to titrate Luvox further ( has had no side effects thus far). No disruptive behaviors on unit- going to groups, interacting well with peers.  Principal Problem: Depression  Diagnosis:   Patient Active Problem List   Diagnosis Date Noted  . MDD (major depressive disorder), recurrent severe, without psychosis [F33.2] 10/11/2014  . MDD (major depressive disorder), recurrent episode, severe [F33.2] 02/19/2014   Total Time spent with patient: 20 minutes   Past Medical History:  Past Medical History  Diagnosis Date  . Depression   . Anxiety     Past Surgical History  Procedure Laterality Date  . Gallbladder surgery     Family History: History reviewed. No pertinent family history. Social History:  History  Alcohol Use No     History  Drug Use No    History   Social History  . Marital Status: Single    Spouse Name: N/A    Number of Children: N/A  . Years of Education: N/A   Social History Main Topics  . Smoking status: Never Smoker   . Smokeless tobacco: Never Used  . Alcohol Use: No  . Drug Use: No  . Sexual Activity: No   Other Topics Concern  . None   Social History Narrative   Additional History:    Sleep: improved   Appetite:  Good    Assessment:   Musculoskeletal: Strength & Muscle Tone: within normal limits Gait & Station: normal Patient leans: N/A   Psychiatric  Specialty Exam: Physical Exam  ROS  Blood pressure 105/73, pulse 73, temperature 97.5 F (36.4 C), temperature source Oral, resp. rate 16, height 5' 2.25" (1.581 m), weight 146 lb (66.225 kg), last menstrual period 09/19/2014.Body mass index is 26.49 kg/(m^2).  General Appearance: Fairly Groomed  Engineer, water::  Good  Speech:  Normal Rate  Volume:  Normal  Mood:  much improved compared to admission and today presents euhtymic  Affect:  Appropriate  Thought Process:  Goal Directed and Linear  Orientation:  Full (Time, Place, and Person)  Thought Content:  denies hallucinations, no delusions  Suicidal Thoughts:  No at this time denies any thoughts of hurting self and  contracts for safety on unit   Homicidal Thoughts:  No  Memory: Recent and Remote grossly intact  Judgement:  Fair  Insight:  Present  Psychomotor Activity:  Normal  Concentration:  Good  Recall:  Good  Fund of Knowledge:Good  Language: Good  Akathisia:  Negative  Handed:  Right  AIMS (if indicated):     Assets:  Communication Skills Desire for Improvement Housing Resilience Social Support  ADL's:  Improved   Cognition: WNL  Sleep:  Number of Hours: 5.75     Current Medications: Current Facility-Administered Medications  Medication Dose Route Frequency Provider Last Rate Last Dose  . diphenhydrAMINE-zinc acetate (BENADRYL) 9-2.9 % cream 1 application  1 application Topical TID PRN Delfin Gant,  NP   1 application at 78/97/84 0744  . fluvoxaMINE (LUVOX) tablet 100 mg  100 mg Oral QHS Laverle Hobby, PA-C   100 mg at 10/12/14 2143  . fluvoxaMINE (LUVOX) tablet 50 mg  50 mg Oral Daily Jenne Campus, MD   50 mg at 10/13/14 1417  . LORazepam (ATIVAN) tablet 0.5 mg  0.5 mg Oral TID Jenne Campus, MD   0.5 mg at 10/13/14 1128  . polyethylene glycol (MIRALAX / GLYCOLAX) packet 17 g  17 g Oral Daily PRN Laverle Hobby, PA-C   17 g at 10/13/14 0753  . tetrahydrozoline 0.05 % ophthalmic solution 2 drop  2  drop Both Eyes TID PRN Laverle Hobby, PA-C        Lab Results: No results found for this or any previous visit (from the past 48 hour(s)).  Physical Findings: AIMS: Facial and Oral Movements Muscles of Facial Expression: None, normal Lips and Perioral Area: None, normal Jaw: None, normal Tongue: None, normal,Extremity Movements Upper (arms, wrists, hands, fingers): None, normal Lower (legs, knees, ankles, toes): None, normal, Trunk Movements Neck, shoulders, hips: None, normal, Overall Severity Severity of abnormal movements (highest score from questions above): None, normal Incapacitation due to abnormal movements: None, normal Patient's awareness of abnormal movements (rate only patient's report): No Awareness, Dental Status Current problems with teeth and/or dentures?: No Does patient usually wear dentures?: No  CIWA:    COWS:      Assessment- Lasundra presents much improved compared to her initial admission status. She presents with full range of affect, and states her mood has normalized. She has had no agitated or impulsive behaviors on unit, and interacts well with peers. She is tolerating Luvox well, and states this medication has been effective .  Treatment Plan Summary: Daily contact with patient to assess and evaluate symptoms and progress in treatment, Medication management, Plan continue inpatient treatment and continue medications as below 1. Increase luvox to 50 mgrs QAM and 100 mgrs QHS 2.Continue Ativan 0.5 mgrs TID 3. Consider discharge soon as patient continues to improve.  Medical Decision Making:  Established Problem, Stable/Improving (1), Review of Psycho-Social Stressors (1), Review of Medication Regimen & Side Effects (2) and Review of New Medication or Change in Dosage (2)     Rhylen Pulido 10/13/2014, 4:15 PM

## 2014-10-13 NOTE — Plan of Care (Signed)
Problem: Diagnosis: Increased Risk For Suicide Attempt Goal: STG-Patient Will Attend All Groups On The Unit Outcome: Progressing Patient is seen attending groups on the unit.

## 2014-10-13 NOTE — [facility] Group Notes (Signed)
   [facility] Note  10/13/2014  8:45 AM   Participation Quality: Alert, Appropriate and Oriented  Mood/Affect: Appropriate  Depression Rating: 1  Anxiety Rating: 3  Thoughts of Suicide: Pt denies SI/HI  Will you contract for safety? Yes  Current AVH: Pt denies  Plan for Discharge/Comments: Pt attended discharge planning group and actively participated in group. CSW provided pt with today's workbook. Patient reports feeling "great today" but expressed concerns regarding her medications which were relayed to MD. Patient is hopeful to discharge back home tomorrow to follow up with her current outpatient providers.   Transportation Means: Pt reports access to transportation  Supports: No supports mentioned at this time  Samuella BruinKristin Johni Narine, MSW, [employer]CSWA Clinical Social Worker [facility] [phone]

## 2014-10-13 NOTE — Plan of Care (Signed)
Problem: Alteration in mood Goal: STG-Patient is able to discuss feelings and issues (Patient is able to discuss feelings and issues leading to depression)  Outcome: Progressing "I feel better really I do, I been talking about God, I met me a friend, we both talk about God"

## 2014-10-13 NOTE — Progress Notes (Signed)
Recreation Therapy Notes  Animal-Assisted Activity/Therapy (AAA/T) Program Checklist/Progress Notes Patient Eligibility Criteria Checklist & Daily Group note for Rec Tx Intervention  Date: 02.02.2016 Time: 2:45pm [address]   AAA/T Program Assumption of Risk Form signed by Patient/ or Parent Legal Guardian yes  Patient is free of allergies or sever asthma yes  Patient reports no fear of animals yes  Patient reports no history of cruelty to animals yes  Patient understands his/her participation is voluntary yes  Patient washes hands before animal contact yes  Patient washes hands after animal contact yes  Behavioral Response: Appropriate   Education: Hand Washing, Appropriate Animal Interaction   Education Outcome: Acknowledges education.   Clinical Observations/Feedback: Patient interacted appropriately with therapy dog, petting him appropriately from floor level, patient asked appropriate questions about therapy dog and his training and shared stories about her pets at home.   Marykay Lexenise L Kaisen Ackers, LRT/CTRS  Chari Parmenter L 10/13/2014 7:43 AM

## 2014-10-14 ENCOUNTER — Encounter (HOSPITAL_COMMUNITY): Payer: Self-pay | Admitting: Registered Nurse

## 2014-10-14 MED ORDER — HYDROXYZINE HCL 50 MG PO TABS: 50.0000 mg | ORAL_TABLET | Freq: Every evening | ORAL | Status: DC | PRN

## 2014-10-14 MED ORDER — HYDROXYZINE HCL 50 MG PO TABS
50.0000 mg | ORAL_TABLET | Freq: Every evening | ORAL | Status: DC | PRN
  Filled 2014-10-14: qty 14

## 2014-10-14 MED ORDER — FLUVOXAMINE MALEATE 50 MG PO TABS: ORAL_TABLET | ORAL | Status: DC

## 2014-10-14 NOTE — Progress Notes (Signed)
  [facility] :  Will you be returning to the same living situation after discharge:  Yes,  patient plans to return to her home At discharge, do you have transportation home?: Yes,  patient reports access to transportation by parents Do you have the ability to pay for your medications: Yes,  patient will be provided with prescriptions at discharge  Release of information consent forms completed and in the chart;  Patient's signature needed at discharge.  Patient to Follow up at: Follow-up Information    [facility].   Why:  Appt w Dr Thalia BloodgoodLundy 10/20/14 at 1:30 PM; appointment w Marjorie SmolderElizabeth Manley November 15, 2014 at9 AM   Contact information:   [address] [place], [place] [address] Phone:  [phone] Fax:  [phone]      Patient denies SI/HI: Yes,  denies    Safety Planning and Suicide Prevention discussed: Yes,  with patient  Has patient been referred to the Quitline?: N/A patient is not a smoker  Avraham Benish, [place] CarboKristin L 10/14/2014, 9:56 AM

## 2014-10-14 NOTE — Progress Notes (Signed)
Pt was discharged home today. She denied any S/I H/I or A/V hallucinations.  She was given f/u appointment, rx, sample medications, and hotline info booklet. She voiced understanding to all instructions provided.  She declined the need for smoking cessation materials.  

## 2014-10-14 NOTE — [facility] Suicide Risk Assessment (Signed)
[facility] INPATIENT:  Family/Significant Other Suicide Prevention Education  Suicide Prevention Education:  Patient Refusal for Family/Significant Other Suicide Prevention Education: The patient Stephanie Lowery has refused to provide written consent for family/significant other to be provided Family/Significant Other Suicide Prevention Education during admission and/or prior to discharge.  Physician notified. SPE reviewed with patient and brochure provided. Patient encouraged to return to hospital if having suicidal thoughts, patient verbalized her understanding and has no further questions at this time.  Stephanie Lowery, [place] L 10/14/2014, 9:18 AM

## 2014-10-14 NOTE — Progress Notes (Signed)
D   Pt is anxious and depressed  She complained of some itching in her eyes and received her eye drops   She interacts minimally with others   She is fidgety and nervous  A   Verbal support given   Medications administered and effectiveness monitored   Q 15 min checks R   Pt safe at present

## 2014-10-14 NOTE — Discharge Summary (Signed)
Physician Discharge Summary Note  Patient:  Stephanie Lowery is an 49 y.o., female MRN:  [id number] DOB:  11-26-1965 Patient phone:  [phone] (home)  Patient address:   [address] [place] [id number],  Total Time spent with patient: Greater than 30 minutes  Date of Admission:  10/11/2014 Date of Discharge: 10/14/2014  Reason for Admission:  Patient is a 49 year old female, known to me from past outpatient treatment. She has a history of Chronic Mood Disorder, Depression. She normally lives with her parents, who are her closest support network. She had recently been more upset because she purchased an expensive computer that broke down soon after she bought it, and she was unable to return it, and felt store where she purchased it was unhelpful.  She also has historically had a difficult relationship with her older brother. In the context of ongoing conflict with her brother, who recently visited( Normally he [place] in [place]) she states she initially locked herself in the closet, but later became acutely agitated and hit a wall, And threw some items at home. She was then brought to ED by her parents. Patient did make statements she wanted to die, resulting in admission. ( Patient has chronic passive thoughts of death) .  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 Today patient is improved , although she remains depressed , tearful when discussing her stressors,and at this time presents calm, cooperative, not agitated . Regarding medications, she states that she has been on Luvox over recent weeks, and states that this medication does seem to help more than previous.   Principal Problem: MDD (major depressive disorder), recurrent severe, without psychosis Discharge Diagnoses: Patient Active Problem List   Diagnosis Date Noted  . MDD (major depressive disorder), recurrent severe, without psychosis [F33.2] 10/11/2014  .  MDD (major depressive disorder), recurrent episode, severe [F33.2] 02/19/2014    Musculoskeletal: Strength & Muscle Tone: within normal limits Gait & Station: normal Patient leans: N/A  Psychiatric Specialty Exam:  See Suicide Risk Assessment Physical Exam  Review of Systems  Psychiatric/Behavioral: Negative for suicidal ideas, hallucinations, memory loss and substance abuse. Depression: Stable. Nervous/anxious: Stable. Insomnia: Stable.     Blood pressure 106/65, pulse 87, temperature 98 F (36.7 C), temperature source Oral, resp. rate 16, height 5' 2.25" (1.581 m), weight 66.225 kg (146 lb), last menstrual period 09/19/2014.Body mass index is 26.49 kg/(m^2).    Past Medical History:  Past Medical History  Diagnosis Date  . Depression   . Anxiety     Past Surgical History  Procedure Laterality Date  . Gallbladder surgery     Family History: History reviewed. No pertinent family history. Social History:  History  Alcohol Use No     History  Drug Use No    History   Social History  . Marital Status: Single    Spouse Name: N/A    Number of Children: N/A  . Years of Education: N/A   Social History Main Topics  . Smoking status: Never Smoker   . Smokeless tobacco: Never Used  . Alcohol Use: No  . Drug Use: No  . Sexual Activity: No   Other Topics Concern  . None   Social History Narrative    Past Psychiatric History: Hospitalizations:  Seven inpatient hospitalization  Outpatient Care:  Past hx with Dr. Thalia BloodgoodLundy  Substance Abuse Care: Denies  Self-Mutilation: history of cutting and bruising self  Suicidal Attempts: history of 5 suicide  attempts  Violent Behaviors: Destroyed property at her home   Risk to Self: Is patient at risk for suicide?: No What has been your use of drugs/alcohol within the last 12 months?: None, denies use Risk to Others:   Prior Inpatient Therapy:   Prior Outpatient Therapy:    Level of Care:  OP  Hospital Course:    Stephanie Lowery was admitted for MDD (major depressive disorder), recurrent severe, without psychosis and crisis management. She was treated with Luvox for depression, Vistaril/Ativan for anxiety.  Medical problems were identified and treated as needed.  Home medications were restarted as appropriate.  Improvement was monitored by observation and Stephanie Lowery daily report of symptom reduction.  Emotional and mental status was monitored by daily self inventory reports completed by Stephanie Cleveland and clinical staff.         Stephanie Lowery was evaluated by the treatment team for stability and plans for continued recovery upon discharge.  She was offered further treatment options upon discharge including Residential, Intensive Outpatient and Outpatient treatment. She will follow up with [facility] with Dr. Thalia Bloodgood and Marjorie Smolder for medication management and counseling.     Stephanie Lowery motivation was an integral factor for scheduling further treatment.  Employment, transportation, bed availability, health status, family support, and any pending legal issues were also considered during her hospital stay.  Upon completion of this admission the patient was both mentally and medically stable for discharge denying suicidal/homicidal ideation, auditory/visual/tactile hallucinations, delusional thoughts and paranoia.       Consults:  psychiatry  Significant Diagnostic Studies:  labs: CBC/Diff, CMET, UDS, Urinalysis, ETOH  Discharge Vitals:   Blood pressure 106/65, pulse 87, temperature 98 F (36.7 C), temperature source Oral, resp. rate 16, height 5' 2.25" (1.581 m), weight 66.225 kg (146 lb), last menstrual period 09/19/2014. Body mass index is 26.49 kg/(m^2). Lab Results:   No results found for this or any previous visit (from the past 72 hour(s)).  Physical Findings: AIMS: Facial and Oral Movements Muscles of Facial Expression: None, normal Lips and Perioral Area: None, normal Jaw:  None, normal Tongue: None, normal,Extremity Movements Upper (arms, wrists, hands, fingers): None, normal Lower (legs, knees, ankles, toes): None, normal, Trunk Movements Neck, shoulders, hips: None, normal, Overall Severity Severity of abnormal movements (highest score from questions above): None, normal Incapacitation due to abnormal movements: None, normal Patient's awareness of abnormal movements (rate only patient's report): No Awareness, Dental Status Current problems with teeth and/or dentures?: No Does patient usually wear dentures?: No  CIWA:    COWS:      See Psychiatric Specialty Exam and Suicide Risk Assessment completed by Attending Physician prior to discharge.  Discharge destination:  Home  Is patient on multiple antipsychotic therapies at discharge:  No   Has Patient had three or more failed trials of antipsychotic monotherapy by history:  No    Recommended Plan for Multiple Antipsychotic Therapies: NA  Discharge Instructions    Discharge instructions    Complete by:  As directed   Take all of you medications as prescribed by your mental healthcare provider.  Report any adverse effects and reactions from your medications to your outpatient provider promptly. Do not engage in alcohol and or illegal drug use while on prescription medicines. In the event of worsening symptoms call the crisis hotline, 911, and or go to the nearest emergency department for appropriate evaluation and treatment of symptoms. Follow-up with your primary care provider for your medical issues, concerns  and or health care needs.   Keep all scheduled appointments.  If you are unable to keep an appointment call to reschedule.  Let the nurse know if you will need medications before next scheduled appointment.            Medication List    STOP taking these medications        ARIPiprazole 2 MG tablet  Commonly known as:  ABILIFY     busPIRone 5 MG tablet  Commonly known as:  BUSPAR      diphenhydrAMINE 25 MG tablet  Commonly known as:  BENADRYL     escitalopram 5 MG tablet  Commonly known as:  LEXAPRO     lithium carbonate 150 MG capsule     LORazepam 0.5 MG tablet  Commonly known as:  ATIVAN     Melatonin 3 MG Tabs     Melatonin 5 MG Tabs      TAKE these medications      Indication   ALPRAZolam 0.5 MG tablet  Commonly known as:  XANAX  Take 2 mg by mouth 2 (two) times daily as needed for anxiety (anxiety).      EYE DROPS OP  Place 2 drops into both eyes 3 (three) times daily as needed (itchy eyes).      fluvoxaMINE 50 MG tablet  Commonly known as:  LUVOX  Take 50 mg (one tablet) every morning and take 100 mg (2 tablets) at bed time for depression   Indication:  Depression     hydrOXYzine 50 MG tablet  Commonly known as:  ATARAX/VISTARIL  Take 1 tablet (50 mg total) by mouth at bedtime as needed for anxiety (sleep).   Indication:  insomnia/anxiety     norethindrone-ethinyl estradiol 1-20 MG-MCG tablet  Commonly known as:  MICROGESTIN  Take 1 tablet by mouth daily.   Indication:  hormonal balance     polyethylene glycol packet  Commonly known as:  MIRALAX / GLYCOLAX  Take 17 g by mouth daily as needed for mild constipation (constipation).            Follow-up Information    [facility] with [facility] On 10/20/2014.   Why:  Appt w Dr Thalia Bloodgood 10/20/14 at 1:30 PM; appointment w Marjorie Smolder November 15, 2014 at9 AM   Contact information:   [address] [place], [place] [address] Phone:  [phone] Fax:  [phone]      Follow-up recommendations:  Activity:  As tolerated Diet:  As tolerated  Comments:   Patient has been instructed to take medications as prescribed; and report adverse effects to outpatient provider.  Follow up with primary doctor for any medical issues and If symptoms recur report to nearest emergency or crisis hot line.    Total Discharge Time: Greater than 30 Minutes  Signed: Assunta Found,  FNP-BC 10/14/2014, 11:03 AM    Patient seen, Suicide Assessment Completed.  Disposition Plan Reviewed

## 2014-10-14 NOTE — [facility] Group Notes (Signed)
0900 nursing orientation group  The focus of this group is to educate the patient on the purpose and policies of crisis stabilization and provide a format to answer questions about their admission.  The group details unit policies and expectations of patients while admitted.   Pt was an active participant and appropriate in sharing with the group.  

## 2014-10-14 NOTE — [facility] Group Notes (Signed)
[facility] LCSW Group Therapy 10/14/2014 1:15 PM Type of Therapy: Group Therapy Participation Level: Active  Participation Quality: Attentive, Sharing and Supportive  Affect: Appropriate  Cognitive: Alert and Oriented  Insight: Developing/Improving and Engaged  Engagement in Therapy: Developing/Improving and Engaged  Modes of Intervention: Activity, Clarification, Confrontation, Discussion, Education, Exploration, Limit-setting, Orientation, Problem-solving, Rapport Building, Reality Testing, Socialization and Support  Summary of Progress/Problems: Patient was attentive and engaged with speaker from [employer]. Patient was attentive to speaker while they shared their story of dealing with mental health and overcoming it. Patient expressed interest in their programs and services and received information on their agency. Patient processed ways they can relate to the speaker.   Toniette Devera, MSW, LCSWA Clinical Social Worker [facility] [phone]   

## 2014-10-14 NOTE — [facility] Suicide Risk Assessment (Signed)
[facility]   Demographic Factors:  49 year old single female, no children, lives with parents   Total Time spent with patient: 30 minutes  Musculoskeletal: Strength & Muscle Tone: within normal limits Gait & Station: normal Patient leans: N/A  Psychiatric Specialty Exam: Physical Exam  ROS  Blood pressure 106/65, pulse 87, temperature 98 F (36.7 C), temperature source Oral, resp. rate 16, height 5' 2.25" (1.581 m), weight 146 lb (66.225 kg), last menstrual period 09/19/2014.Body mass index is 26.49 kg/(m^2).  General Appearance: improved grooming  Eye Contact::  Good  Speech:  Normal Rate  Volume:  Normal  Mood:  improved, at this time euthymic  Affect:  Appropriate and Full Range  Thought Process:  Goal Directed and Linear  Orientation:  Full (Time, Place, and Person)  Thought Content:  no hallucinations, no delusions  Suicidal Thoughts:  No at this time denies any thoughts of hurting self or anyone else   Homicidal Thoughts:  No  Memory:  recent and remote grossly intact   Judgement:  Other:  improved   Insight:  Fair  Psychomotor Activity:  Normal  Concentration:  Good  Recall:  Good  Fund of Knowledge:Good  Language: Good  Akathisia:  Negative  Handed:  Right  AIMS (if indicated):     Assets:  Communication Skills Desire for Improvement Housing Resilience Social Support  Sleep:  Number of Hours: 4  Cognition: WNL  ADL's: improved   Have you used any form of tobacco in the last 30 days? (Cigarettes, Smokeless Tobacco, Cigars, and/or Pipes): No  Has this patient used any form of tobacco in the last 30 days? (Cigarettes, Smokeless Tobacco, Cigars, and/or Pipes) No  Mental Status Per Nursing Assessment::   On Admission:  NA  Current Mental Status by Physician: At this time patient is much improved compared to admission. She is presenting euthymic and denies any current sadness or depression. Affect is appropriate, no thought  disorder, no SI or HI, no psychotic symptoms. Future oriented at present .  Loss Factors: disability, difficult relationship with brother  Historical Factors: prior history of depression and suicidal attempts or thoughts resulting in psychiatric admissions.  * Of note, patient states she has been doing better overall compared to her condition several years ago, and states that she has been improving/stabilizing more quickly in the past , feels she has better access to coping skills than she did before.   Risk Reduction Factors:   Sense of responsibility to family, Living with another person, especially a relative and Positive coping skills or problem solving skills  Continued Clinical Symptoms:  At this time patient is improved, and presents calm, euthymic, with bright affect. No SI or HI, no psychotic symptoms.  Cognitive Features That Contribute To Risk:  No gross cognitive deficits noted upon discharge. Is alert , attentive, and oriented x 3    Suicide Risk:  Mild:  Suicidal ideation of limited frequency, intensity, duration, and specificity.  There are no identifiable plans, no associated intent, mild dysphoria and related symptoms, good self-control (both objective and subjective assessment), few other risk factors, and identifiable protective factors, including available and accessible social support.  Principal Problem: MDD (major depressive disorder), recurrent severe, without psychosis Discharge Diagnoses:  Patient Active Problem List   Diagnosis Date Noted  . MDD (major depressive disorder), recurrent severe, without psychosis [F33.2] 10/11/2014  . MDD (major depressive disorder), recurrent episode, severe [F33.2] 02/19/2014    Follow-up Information    Follow  [facility] with [facility] On 10/20/2014.   Why:  Appt w Dr Thalia Bloodgood 10/20/14 at 1:30 PM; appointment w Marjorie Smolder November 15, 2014 at9 AM   Contact information:   [address] [place], [place] [address] Phone:   [phone] Fax:  [phone]      Plan Of Care/Follow-up recommendations:  Activity:  As tolerated Diet:  Regular Tests:  NA Other:  See below  Is patient on multiple antipsychotic therapies at discharge:  No   Has Patient had three or more failed trials of antipsychotic monotherapy by history:  No  Recommended Plan for Multiple Antipsychotic Therapies: NA  Patient is leaving unit in good spirits. She has requested discharge and there are no current grounds for involuntary commitment. She is going home. She is planning on continuing outpatient psychiatric management and therapy at [facility] in [place], as above   [place], [facility] 10/14/2014, 2:14 PM

## 2014-10-19 NOTE — Progress Notes (Signed)
Patient Discharge Instructions:  After Visit Summary (AVS):   Faxed to:  10/19/14 Discharge Summary Note:   Faxed to:  10/19/14 Psychiatric Admission Assessment Note:   Faxed to:  10/19/14 Suicide Risk Assessment - Discharge Assessment:   Faxed to:  10/19/14 Faxed/Sent to the Next Level Care provider:  10/19/14 [facility] @ [phone]  Jerelene ReddenSheena E [place], 10/19/2014, 3:31 PM

## 2014-12-06 ENCOUNTER — Encounter (HOSPITAL_COMMUNITY): Payer: Self-pay | Admitting: *Deleted

## 2014-12-06 ENCOUNTER — Emergency Department (HOSPITAL_COMMUNITY)
Admission: EM | Admit: 2014-12-06 | Discharge: 2014-12-06 | Disposition: A | Discharge status: Other Acute Inpatient Hospital | Payer: Medicare Other | Source: Home / Self Care | Attending: Emergency Medicine | Admitting: Emergency Medicine

## 2014-12-06 ENCOUNTER — Inpatient Hospital Stay (HOSPITAL_COMMUNITY)
Admission: AD | Admit: 2014-12-06 | Discharge: 2014-12-09 | DRG: 885 | Disposition: A | Discharge status: Home / Self Care | Payer: Medicare Other | Source: Intra-hospital | Attending: Psychiatry | Admitting: Psychiatry

## 2014-12-06 ENCOUNTER — Encounter (HOSPITAL_COMMUNITY): Payer: Self-pay | Admitting: Emergency Medicine

## 2014-12-06 DIAGNOSIS — Z79899 Other long term (current) drug therapy: Secondary | ICD-10-CM

## 2014-12-06 DIAGNOSIS — F410 Panic disorder [episodic paroxysmal anxiety] without agoraphobia: Secondary | ICD-10-CM | POA: Diagnosis not present

## 2014-12-06 DIAGNOSIS — F332 Major depressive disorder, recurrent severe without psychotic features: Secondary | ICD-10-CM | POA: Diagnosis present

## 2014-12-06 DIAGNOSIS — R45851 Suicidal ideations: Secondary | ICD-10-CM

## 2014-12-06 DIAGNOSIS — F419 Anxiety disorder, unspecified: Secondary | ICD-10-CM | POA: Diagnosis not present

## 2014-12-06 DIAGNOSIS — F32A Depression, unspecified: Secondary | ICD-10-CM

## 2014-12-06 DIAGNOSIS — F329 Major depressive disorder, single episode, unspecified: Secondary | ICD-10-CM

## 2014-12-06 LAB — COMPREHENSIVE METABOLIC PANEL
ALK PHOS: 78 U/L (ref 39–117)
ALT: 11 U/L (ref 0–35)
AST: 21 U/L (ref 0–37)
Albumin: 3.8 g/dL (ref 3.5–5.2)
Anion gap: 10 (ref 5–15)
BUN: 14 mg/dL (ref 6–23)
CO2: 25 mmol/L (ref 19–32)
Calcium: 9.1 mg/dL (ref 8.4–10.5)
Chloride: 106 mmol/L (ref 96–112)
Creatinine, Ser: 0.92 mg/dL (ref 0.50–1.10)
GFR, EST AFRICAN AMERICAN: 84 mL/min — AB (ref >90)
GFR, EST NON AFRICAN AMERICAN: 72 mL/min — AB (ref >90)
Glucose, Bld: 99 mg/dL (ref 70–99)
Potassium: 4 mmol/L (ref 3.5–5.1)
Sodium: 141 mmol/L (ref 135–145)
Total Bilirubin: 0.2 mg/dL — ABNORMAL LOW (ref 0.3–1.2)
Total Protein: 6.8 g/dL (ref 6.0–8.3)

## 2014-12-06 LAB — CBC
HEMATOCRIT: 39.4 % (ref 36.0–46.0)
HEMOGLOBIN: 13 g/dL (ref 12.0–15.0)
MCH: 34 pg (ref 26.0–34.0)
MCHC: 33 g/dL (ref 30.0–36.0)
MCV: 103.1 fL — ABNORMAL HIGH (ref 78.0–100.0)
Platelets: 355 10*3/uL (ref 150–400)
RBC: 3.82 MIL/uL — ABNORMAL LOW (ref 3.87–5.11)
RDW: 11.9 % (ref 11.5–15.5)
WBC: 7 10*3/uL (ref 4.0–10.5)

## 2014-12-06 LAB — RAPID URINE DRUG SCREEN, HOSP PERFORMED
Amphetamines: NOT DETECTED
BARBITURATES: NOT DETECTED
BENZODIAZEPINES: NOT DETECTED
Cocaine: NOT DETECTED
OPIATES: NOT DETECTED
Tetrahydrocannabinol: NOT DETECTED

## 2014-12-06 LAB — ETHANOL: Alcohol, Ethyl (B): <5 mg/dL (ref 0–9)

## 2014-12-06 LAB — ACETAMINOPHEN LEVEL: Acetaminophen (Tylenol), Serum: <10 ug/mL — ABNORMAL LOW (ref 10–30)

## 2014-12-06 LAB — SALICYLATE LEVEL

## 2014-12-06 MED ORDER — FLUTICASONE PROPIONATE 50 MCG/ACT NA SUSP
2.0000 | Freq: Two times a day (BID) | NASAL | Status: DC
  Filled 2014-12-06: qty 16

## 2014-12-06 MED ORDER — ONDANSETRON HCL 4 MG PO TABS: 4.0000 mg | ORAL_TABLET | Freq: Three times a day (TID) | ORAL | Status: DC | PRN

## 2014-12-06 MED ORDER — NICOTINE 21 MG/24HR TD PT24
21.0000 mg | MEDICATED_PATCH | Freq: Every day | TRANSDERMAL | Status: DC
  Filled 2014-12-06 (×2): qty 1

## 2014-12-06 MED ORDER — ACETAMINOPHEN 325 MG PO TABS: 650.0000 mg | ORAL_TABLET | ORAL | Status: DC | PRN

## 2014-12-06 MED ORDER — FLUVOXAMINE MALEATE 50 MG PO TABS
50.0000 mg | ORAL_TABLET | Freq: Every day | ORAL | Status: DC
  Administered 2014-12-07 – 2014-12-09 (×3): 50 mg via ORAL
  Filled 2014-12-06 (×2): qty 1
  Filled 2014-12-06: qty 42
  Filled 2014-12-06 (×2): qty 1

## 2014-12-06 MED ORDER — POLYETHYLENE GLYCOL 3350 17 G PO PACK
17.0000 g | PACK | Freq: Every day | ORAL | Status: DC
  Administered 2014-12-06: 17 g via ORAL
  Filled 2014-12-06: qty 1

## 2014-12-06 MED ORDER — ARIPIPRAZOLE 2 MG PO TABS
2.0000 mg | ORAL_TABLET | Freq: Every day | ORAL | Status: DC
  Filled 2014-12-06: qty 1

## 2014-12-06 MED ORDER — LORAZEPAM 0.5 MG PO TABS: 0.5000 mg | ORAL_TABLET | Freq: Two times a day (BID) | ORAL | Status: DC | PRN

## 2014-12-06 MED ORDER — ALPRAZOLAM 1 MG PO TABS: 2.0000 mg | ORAL_TABLET | Freq: Two times a day (BID) | ORAL | Status: DC | PRN

## 2014-12-06 MED ORDER — ALUM & MAG HYDROXIDE-SIMETH 200-200-20 MG/5ML PO SUSP: 30.0000 mL | ORAL | Status: DC | PRN

## 2014-12-06 MED ORDER — LORAZEPAM 1 MG PO TABS: 1.0000 mg | ORAL_TABLET | Freq: Three times a day (TID) | ORAL | Status: DC | PRN

## 2014-12-06 MED ORDER — FLUVOXAMINE MALEATE 100 MG PO TABS
100.0000 mg | ORAL_TABLET | Freq: Every day | ORAL | Status: DC
  Administered 2014-12-06 – 2014-12-08 (×3): 100 mg via ORAL
  Filled 2014-12-06 (×4): qty 1
  Filled 2014-12-06: qty 2
  Filled 2014-12-06: qty 1

## 2014-12-06 MED ORDER — BENZTROPINE MESYLATE 0.5 MG PO TABS
0.5000 mg | ORAL_TABLET | Freq: Every day | ORAL | Status: DC
  Administered 2014-12-06 – 2014-12-07 (×2): 0.5 mg via ORAL
  Filled 2014-12-06 (×5): qty 1

## 2014-12-06 MED ORDER — DIPHENHYDRAMINE HCL 25 MG PO CAPS: 50.0000 mg | ORAL_CAPSULE | Freq: Four times a day (QID) | ORAL | Status: DC | PRN

## 2014-12-06 MED ORDER — ARIPIPRAZOLE 2 MG PO TABS
2.0000 mg | ORAL_TABLET | Freq: Every day | ORAL | Status: DC
  Administered 2014-12-06 – 2014-12-07 (×2): 2 mg via ORAL
  Filled 2014-12-06 (×5): qty 1

## 2014-12-06 MED ORDER — RISPERIDONE 0.5 MG PO TABS
0.5000 mg | ORAL_TABLET | Freq: Every evening | ORAL | Status: DC | PRN
  Administered 2014-12-06 – 2014-12-07 (×3): 0.5 mg via ORAL
  Filled 2014-12-06 (×9): qty 1

## 2014-12-06 MED ORDER — FLUVOXAMINE MALEATE 50 MG PO TABS
50.0000 mg | ORAL_TABLET | Freq: Two times a day (BID) | ORAL | Status: DC
  Filled 2014-12-06: qty 2

## 2014-12-06 MED ORDER — DIPHENHYDRAMINE HCL 25 MG PO CAPS
50.0000 mg | ORAL_CAPSULE | Freq: Four times a day (QID) | ORAL | Status: DC | PRN
Start: 2014-12-06 — End: 2014-12-06

## 2014-12-06 MED ORDER — FLUTICASONE PROPIONATE 50 MCG/ACT NA SUSP
2.0000 | Freq: Two times a day (BID) | NASAL | Status: DC
Start: 2014-12-06 — End: 2014-12-09
  Administered 2014-12-06 – 2014-12-08 (×5): 2 via NASAL
  Filled 2014-12-06 (×2): qty 16

## 2014-12-06 MED ORDER — POLYETHYLENE GLYCOL 3350 17 G PO PACK
17.0000 g | PACK | Freq: Every day | ORAL | Status: DC
  Administered 2014-12-07 – 2014-12-09 (×3): 17 g via ORAL
  Filled 2014-12-06 (×5): qty 1

## 2014-12-06 NOTE — Progress Notes (Signed)
[facility]. She presented to the ED c/o severe depression w/ SI, no plan, increase anxiety and having difficulty sleeping. Denies SI/HI @ present to Clinical research associatewriter and verbally contracts for safety inside hospital only. -A/[employer].  Reported history of multiple suicide attempts and considered recently running her car off the road.  C/o stressors being family issues, feeling hopeless, helpless, loneliness, frequent panic attacks and medication no longer working. Pt admitted for evaluation, stabilization and reduction of baseline. Will monitor closely.

## 2014-12-06 NOTE — Progress Notes (Signed)
D:Patient visible on the unit.  Patient states she is extremely anxious and her major complaint is not sleeping.  Patient states she stays up for 30 hours or more and it is increasing her anxiety.  Patient denies SI/HI and denies AVH.  Patient states she feels safe while she is here.  Patient states she wants  Some medications that will help because she states she is frustrated. A: Staff to monitor Q 15 mins for safety.  Encouragement and support offered.  Scheduled medications administered per orders. R: Patient remains safe on the unit.  Patient attended group tonight.  Patient visible on the unit and interacting with peers.  Patient taking administered medications
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
## 2014-12-06 NOTE — Progress Notes (Signed)
[facility] Group Notes:  (Nursing/MHT/Case Management/Adjunct)  Date:  12/06/2014  Time:  11:58 PM  Type of Therapy:  Group Therapy  Participation Level:  Active  Participation Quality:  Appropriate  Affect:  Appropriate  Cognitive:  Appropriate  Insight:  Appropriate  Engagement in Group:  Engaged  Modes of Intervention:  Socialization and Support  Summary of Progress/Problems: Pt. Was engaged and had good insight in group.  Pt. Stated she does more activities outside of the house when she is feeling well.  Sondra ComeWilson, Surabhi Gadea J 12/06/2014, 11:58 PM
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
## 2014-12-06 NOTE — Tx Team (Signed)
Initial Interdisciplinary Treatment Plan   PATIENT STRESSORS: Marital or family conflict Medication change or noncompliance   PATIENT STRENGTHS: Ability for insight Average or above average intelligence Capable of independent living Communication skills Motivation for treatment/growth   PROBLEM LIST: Problem List/Patient Goals Date to be addressed Date deferred Reason deferred Estimated date of resolution  depression 12/06/14   At d/c  anxiety 12/06/14   At d/c  Suicidal ideation 12/06/14   At d/c                                       DISCHARGE CRITERIA:  Improved stabilization in mood, thinking, and/or behavior Motivation to continue treatment in a less acute level of care Need for constant or close observation no longer present Reduction of life-threatening or endangering symptoms to within safe limits Verbal commitment to aftercare and medication compliance  PRELIMINARY DISCHARGE PLAN: Outpatient therapy Return to previous living arrangement  PATIENT/FAMIILY INVOLVEMENT: This treatment plan has been presented to and reviewed with the patient, Stephanie Lowery  The patient and family have been given the opportunity to ask questions and make suggestions.  [name], [name] 12/06/2014, 10:13 PM

## 2014-12-06 NOTE — ED Notes (Signed)
Pt states she is having sever depression and anxiety and is having a difficult time sleeping. Pt states she has SI w/o plan but no HI.

## 2014-12-06 NOTE — [facility] Counselor (Signed)
Accepted to [facility] per [facility] notified. Can transport at any time. Report # [id number]  Kateri PlummerKristin Labaron Digirolamo, M.S., LPCA, Bridget HartshornLCASA, [facility] Licensed Professional Counselor Associate  Triage Specialist  [facility]  Therapeutic Triage Services Phone: [phone] Fax: [phone]

## 2014-12-06 NOTE — ED Notes (Signed)
New admit with c/o increased anxiety and sleeplessness for several days. Takes anxiolytics but they are no longer working. Came in SI, but feels safe now and denies SI. Denies HI/AVH and contracts for safety. No acute distress noted. Q15 minute safety checks initiated. Acuity Low

## 2014-12-06 NOTE — [facility] Counselor (Signed)
Support paperwork signed and faxed to TTS  Kateri PlummerKristin Amjad Fikes, M.S., LPCA, [place]ASA, [facility] Licensed Professional Counselor Associate  Triage Specialist  [facility]  Therapeutic Triage Services Phone: [phone] Fax: [phone]

## 2014-12-07 DIAGNOSIS — F419 Anxiety disorder, unspecified: Secondary | ICD-10-CM

## 2014-12-07 DIAGNOSIS — F332 Major depressive disorder, recurrent severe without psychotic features: Principal | ICD-10-CM

## 2014-12-07 DIAGNOSIS — F410 Panic disorder [episodic paroxysmal anxiety] without agoraphobia: Secondary | ICD-10-CM

## 2014-12-07 NOTE — Progress Notes (Signed)
[facility] Group Notes:  (Nursing/MHT/Case Management/Adjunct)  Date:  12/07/2014  Time:  11:57 PM  Type of Therapy:  Group Therapy  Participation Level:  Active  Participation Quality:  Appropriate  Affect:  Appropriate  Cognitive:  Appropriate  Insight:  Appropriate  Engagement in Group:  Engaged  Modes of Intervention:  Socialization and Support  Summary of Progress/Problems: Pt. Was engaged in group discussion on recovery.  Pt. Stated her recovery involves taking medication and being more social.  Sondra ComeWilson, Khamauri Bauernfeind J 12/07/2014, 11:57 PM

## 2014-12-07 NOTE — Tx Team (Addendum)
Interdisciplinary Treatment Plan Update   Date Reviewed:  12/07/2014  Time Reviewed:  8:47 AM  Progress in Treatment:   Attending groups: Yes, patient is attending groups. Participating in groups: Yes, engages in group discussion. Taking medication as prescribed: Yes  Tolerating medication: Yes Family/Significant other contact made:  No, but will ask patient for consent for collateral contact Patient understands diagnosis: Yes, patient understands diagnosis and need for treatment. Discussing patient identified problems/goals with staff: Yes, patient is able to express goals for treatment and discharge. Medical problems stabilized or resolved: Yes Denies suicidal/homicidal ideation: Yes Patient has not harmed self or others: Yes  For review of initial/current patient goals, please see plan of care.  Estimated Length of Stay:  3-5 days  Reasons for Continued Hospitalization:  Anxiety Depression Medication stabilization   New Problems/Goals identified:    Discharge Plan or Barriers:   Home with outpatient follow up to be determined  Additional Comments: Continue medication stabilization  Patient and CSW reviewed patient's identified goals and treatment plan.  Patient verbalized understanding and agreed to treatment plan.   Attendees:  Patient:  12/07/2014 8:47 AM   Signature:  [name], MD 12/07/2014 8:47 AM  Signature:  12/07/2014 8:47 AM  Signature:  Earl ManySara Twyman, RN 12/07/2014 8:47 AM  Signature: Liborio NixonPatrice White, RN 12/07/2014 8:47 AM  Signature:   12/07/2014 8:47 AM  Signature:  Juline PatchQuylle Elion Hocker, LCSW 12/07/2014 8:47 AM  Signature:  Belenda CruiseKristin Drinkard, LCSW-A 12/07/2014 8:47 AM  Signature:  [name], Care Coordinator [facility] 12/07/2014 8:47 AM  Signature:   12/07/2014 8:47 AM  Signature:  12/07/2014  8:47 AM  Signature:   [name], RN [facility] 12/07/2014  8:47 AM  Signature:   12/07/2014  8:47 AM    Scribe for Treatment Team:   Juline PatchQuylle Kynlei Piontek,  12/07/2014 8:47 AM

## 2014-12-07 NOTE — Progress Notes (Signed)
Patient ID: Stephanie Lowery, female   DOB: Jul 04, 1966, 49 y.o.   MRN: [id number] PER STATE REGULATIONS 482.30  THIS CHART WAS REVIEWED FOR MEDICAL NECESSITY WITH RESPECT TO THE PATIENT'S ADMISSION/DURATION OF STAY.  NEXT REVIEW DATE: 12/10/14  Loura HaltBARBARA Araiya Tilmon, RN, BSN CASE MANAGER

## 2014-12-07 NOTE — [facility] Suicide Risk Assessment (Signed)
[facility] INPATIENT:  Family/Significant Other Suicide Prevention Education  Suicide Prevention Education:  Patient Refusal for Family/Significant Other Suicide Prevention Education: The patient Stephanie Lowery has refused to provide written consent for family/significant other to be provided Family/Significant Other Suicide Prevention Education during admission and/or prior to discharge.  Physician notified.  Wynn BankerHodnett, Lorisa Scheid Hairston 12/07/2014, 12:59 PM

## 2014-12-07 NOTE — H&P (Signed)
Psychiatric Admission Assessment Adult  Patient Identification: Stephanie Lowery MRN:  [id number] Date of Evaluation:  12/07/2014 Chief Complaint:  MAJOR DEPRESSIVE DEPRESSIVE, RECURRENT, SEVERE PANIC DISORDER  Principal Diagnosis: Anxiety Disorder NOS, Panic Disorder, MDD  Diagnosis:   Patient Active Problem List   Diagnosis Date Noted  . MDD (major depressive disorder), recurrent severe, without psychosis [F33.2] 10/11/2014  . MDD (major depressive disorder), recurrent episode, severe [F33.2] 02/19/2014   History of Present Illness::  Patient is a 49 year old female, well known to Probation officer and to unit from prior admissions, most recently in early February/16. She reports increased anxiety, sleeplessness recently related to ongoing  Psychosocial stressors. She has a difficult  Relationship with her brother, and tends to be frail, decompensating easily with stressors, primarily regarding family. Recently felt more anxious,  With panic attacks at times. To a lesser degree,she had also felt depressed and was having thoughts of driving her car out of the road . In general, however, states her mood has tended to improve. She states " I really got upset when my brother said he wished my dad would die or go away soon". ( Patient lives with father on and off , and is very close to father, who is now 52, and has a history of prostate cancer).  States she was able to confront her brother via a letter, and states " he actually replied this time and did apologize ", but she remains ruminative about it. States she has been unable to sleep well for several days, and was sleeping only 1-3 hours per night over the last few days . Elements:  Worsening anxiety in the context of family stressors  Associated Signs/Symptoms: Depression Symptoms:  States depressive symptoms, which have been chronic , are better. For example her sense of self esteem is better, she is less anhedonic, she is less prone to tearfulness,  energy level is better . (Hypo) Manic Symptoms:  Denies mania , and does not feel insomnia is related to mania  Anxiety Symptoms:   (+) increased anxiety and panic attacks . Panic attacks are often triggered by family issues, brother. Psychotic Symptoms:  None  PTSD Symptoms: Denies .  Total Time spent with patient: 45 minutes   Past Psychiatric History- Long history  Of depression, anxiety, and intermittent suicidal ideations. She has had multiple admissions, most recently  2/1 - 2/4 /16, at which time she was diagnosed with  MDD , Recurrent. She had been on Luvox, which she continues to take at 150 mgrs a day, she also was on Xanax but has stopped this medication because she feels it does not work. Of note, has had ten recent Trans cranial magnetic treatments with Dr. Geryl Councilman in [place] . Her outpatient psychiatrist  Is Dr. Penelope Galas at [facility] in [place] .  Past Medical History: Denies any medical illnesses , history  Of cholecystectomy.  Of note, has chronically elevated MCV, but states work up by her PCP negative . Past Medical History  Diagnosis Date  . Depression   . Anxiety     Past Surgical History  Procedure Laterality Date  . Gallbladder surgery     Family History:  History of Depression and suicides in extended family.  Social History: Divorced 30 years ago, no children, at this time living alone in her apartment, but often stays with her parents, on disability, no legal issues. History  Alcohol Use No     History  Drug Use No    History  Social History  . Marital Status: Single    Spouse Name: N/A  . Number of Children: N/A  . Years of Education: N/A   Social History Main Topics  . Smoking status: Never Smoker   . Smokeless tobacco: Never Used  . Alcohol Use: No  . Drug Use: No  . Sexual Activity: No   Other Topics Concern  . None   Social History Narrative   Additional Social History:    Pain Medications: denies Prescriptions: see PTA  list Over the Counter: denies History of alcohol / drug use?: No history of alcohol / drug abuse   Musculoskeletal: Strength & Muscle Tone: within normal limits Gait & Station: normal Patient leans: N/A  Psychiatric Specialty Exam: Physical Exam  Review of Systems  Constitutional: Negative for fever and chills.  Respiratory: Negative for cough and shortness of breath.   Cardiovascular: Negative for chest pain.  Gastrointestinal: Negative for nausea, vomiting and abdominal pain.  Skin: Negative for rash.  Neurological: Negative for headaches.  Psychiatric/Behavioral: Positive for depression. The patient is nervous/anxious.     Blood pressure 126/72, pulse 113, temperature 98.6 F (37 C), temperature source Oral, resp. rate 18, height 5' 3"  (1.6 m), weight 137 lb (62.143 kg), last menstrual period 11/25/2014.Body mass index is 24.27 kg/(m^2).  General Appearance: Fairly Groomed  Engineer, water::  Good  Speech:  Normal Rate  Volume:  Normal  Mood:  depressed but improved at this time  Affect:  anxious   Thought Process:  Goal Directed and Linear  Orientation:  Full (Time, Place, and Person)  Thought Content:  no hallucinations, no delusions  Suicidal Thoughts:  No- denies any current suicidal ideations, no homicidal ideations  Homicidal Thoughts:  No  Memory:  Recent and remote grossly intact   Judgement:  Good  Insight:  Good  Psychomotor Activity:  Normal  Concentration:  Good  Recall:  Good  Fund of Knowledge:Good  Language: Good  Akathisia:  Negative  Handed:  Right  AIMS (if indicated):     Assets:  Communication Skills Desire for Improvement Physical Health Resilience  ADL's: improved   Cognition: WNL  Sleep:  Number of Hours: 6   Risk to Self: Is patient at risk for suicide?: Yes Risk to Others:   Prior Inpatient Therapy:   Prior Outpatient Therapy:    Alcohol Screening: 1. How often do you have a drink containing alcohol?: Never 2. How many drinks  containing alcohol do you have on a typical day when you are drinking?: 1 or 2 3. How often do you have six or more drinks on one occasion?: Never Preliminary Score: 0 4. How often during the last year have you found that you were not able to stop drinking once you had started?: Never 5. How often during the last year have you failed to do what was normally expected from you becasue of drinking?: Never 6. How often during the last year have you needed a first drink in the morning to get yourself going after a heavy drinking session?: Never 7. How often during the last year have you had a feeling of guilt of remorse after drinking?: Never 8. How often during the last year have you been unable to remember what happened the night before because you had been drinking?: Never 9. Have you or someone else been injured as a result of your drinking?: No 10. Has a relative or friend or a doctor or another health worker been concerned about your drinking  or suggested you cut down?: No Alcohol Use Disorder Identification Test Final Score (AUDIT): 0 Brief Intervention: AUDIT score less than 7 or less-screening does not suggest unhealthy drinking-brief intervention not indicated  Allergies:   Allergies  Allergen Reactions  . Ambien [Zolpidem Tartrate] Other (See Comments)    jittery  . Haldol [Haloperidol Lactate]   . Lunesta [Eszopiclone]     Jittery   . Promethazine     jittery  . Trazodone And Nefazodone Other (See Comments)    Migraines    Lab Results:  Results for orders placed or performed during the hospital encounter of 12/06/14 (from the past 48 hour(s))  Urine Drug Screen     Status: None   Collection Time: 12/06/14  1:03 PM  Result Value Ref Range   Opiates NONE DETECTED NONE DETECTED   Cocaine NONE DETECTED NONE DETECTED   Benzodiazepines NONE DETECTED NONE DETECTED   Amphetamines NONE DETECTED NONE DETECTED   Tetrahydrocannabinol NONE DETECTED NONE DETECTED   Barbiturates NONE  DETECTED NONE DETECTED    Comment:        DRUG SCREEN FOR MEDICAL PURPOSES ONLY.  IF CONFIRMATION IS NEEDED FOR ANY PURPOSE, NOTIFY LAB WITHIN 5 DAYS.        LOWEST DETECTABLE LIMITS FOR URINE DRUG SCREEN Drug Class       Cutoff (ng/mL) Amphetamine      1000 Barbiturate      200 Benzodiazepine   778 Tricyclics       242 Opiates          300 Cocaine          300 THC              50   Acetaminophen level     Status: Abnormal   Collection Time: 12/06/14  1:48 PM  Result Value Ref Range   Acetaminophen (Tylenol), Serum <10.0 (L) 10 - 30 ug/mL    Comment:        THERAPEUTIC CONCENTRATIONS VARY SIGNIFICANTLY. A RANGE OF 10-30 ug/mL MAY BE AN EFFECTIVE CONCENTRATION FOR MANY PATIENTS. HOWEVER, SOME ARE BEST TREATED AT CONCENTRATIONS OUTSIDE THIS RANGE. ACETAMINOPHEN CONCENTRATIONS >150 ug/mL AT 4 HOURS AFTER INGESTION AND >50 ug/mL AT 12 HOURS AFTER INGESTION ARE OFTEN ASSOCIATED WITH TOXIC REACTIONS.   CBC     Status: Abnormal   Collection Time: 12/06/14  1:48 PM  Result Value Ref Range   WBC 7.0 4.0 - 10.5 K/uL   RBC 3.82 (L) 3.87 - 5.11 MIL/uL   Hemoglobin 13.0 12.0 - 15.0 g/dL   HCT 39.4 36.0 - 46.0 %   MCV 103.1 (H) 78.0 - 100.0 fL   MCH 34.0 26.0 - 34.0 pg   MCHC 33.0 30.0 - 36.0 g/dL   RDW 11.9 11.5 - 15.5 %   Platelets 355 150 - 400 K/uL  Comprehensive metabolic panel     Status: Abnormal   Collection Time: 12/06/14  1:48 PM  Result Value Ref Range   Sodium 141 135 - 145 mmol/L   Potassium 4.0 3.5 - 5.1 mmol/L   Chloride 106 96 - 112 mmol/L   CO2 25 19 - 32 mmol/L   Glucose, Bld 99 70 - 99 mg/dL   BUN 14 6 - 23 mg/dL   Creatinine, Ser 0.92 0.50 - 1.10 mg/dL   Calcium 9.1 8.4 - 10.5 mg/dL   Total Protein 6.8 6.0 - 8.3 g/dL   Albumin 3.8 3.5 - 5.2 g/dL   AST 21 0 - 37 U/L  ALT 11 0 - 35 U/L   Alkaline Phosphatase 78 39 - 117 U/L   Total Bilirubin 0.2 (L) 0.3 - 1.2 mg/dL   GFR calc non Af Amer 72 (L) >90 mL/min   GFR calc Af Amer 84 (L) >90 mL/min     Comment: (NOTE) The eGFR has been calculated using the CKD EPI equation. This calculation has not been validated in all clinical situations. eGFR's persistently <90 mL/min signify possible Chronic Kidney Disease.    Anion gap 10 5 - 15  Ethanol (ETOH)     Status: None   Collection Time: 12/06/14  1:48 PM  Result Value Ref Range   Alcohol, Ethyl (B) <5 0 - 9 mg/dL    Comment:        LOWEST DETECTABLE LIMIT FOR SERUM ALCOHOL IS 11 mg/dL FOR MEDICAL PURPOSES ONLY   Salicylate level     Status: None   Collection Time: 12/06/14  1:48 PM  Result Value Ref Range   Salicylate Lvl <2.8 2.8 - 20.0 mg/dL   Current Medications: Current Facility-Administered Medications  Medication Dose Route Frequency Provider Last Rate Last Dose  . acetaminophen (TYLENOL) tablet 650 mg  650 mg Oral Q4H PRN Delfin Gant, NP      . ALPRAZolam Duanne Moron) tablet 2 mg  2 mg Oral BID PRN Delfin Gant, NP      . alum & mag hydroxide-simeth (MAALOX/MYLANTA) 200-200-20 MG/5ML suspension 30 mL  30 mL Oral PRN Delfin Gant, NP      . ARIPiprazole (ABILIFY) tablet 2 mg  2 mg Oral QHS Delfin Gant, NP   2 mg at 12/06/14 2227  . benztropine (COGENTIN) tablet 0.5 mg  0.5 mg Oral Daily Laverle Hobby, PA-C   0.5 mg at 12/07/14 7681  . fluticasone (FLONASE) 50 MCG/ACT nasal spray 2 spray  2 spray Each Nare BID Delfin Gant, NP   2 spray at 12/07/14 1654  . fluvoxaMINE (LUVOX) tablet 100 mg  100 mg Oral QHS Jenne Campus, MD   100 mg at 12/06/14 2227  . fluvoxaMINE (LUVOX) tablet 50 mg  50 mg Oral Q breakfast Delfin Gant, NP   50 mg at 12/07/14 0824  . ondansetron (ZOFRAN) tablet 4 mg  4 mg Oral Q8H PRN Delfin Gant, NP      . polyethylene glycol (MIRALAX / GLYCOLAX) packet 17 g  17 g Oral Daily Delfin Gant, NP   17 g at 12/07/14 0824  . risperiDONE (RISPERDAL) tablet 0.5 mg  0.5 mg Oral QHS,MR X 1 Spencer E Simon, PA-C   0.5 mg at 12/06/14 2227   PTA  Medications: Prescriptions prior to admission  Medication Sig Dispense Refill Last Dose  . ALPRAZolam (XANAX) 0.5 MG tablet Take 2 mg by mouth 2 (two) times daily as needed for anxiety (anxiety).   Past Week at Unknown time  . ARIPiprazole (ABILIFY) 2 MG tablet Take 2 mg by mouth at bedtime.   12/05/2014  . clobetasol cream (TEMOVATE) 1.57 % Apply 1 application topically 2 (two) times daily.   12/06/2014 at Unknown time  . diphenhydrAMINE (BENADRYL) 25 mg capsule Take 50 mg by mouth every 6 (six) hours as needed for sleep.   12/06/2014 at Unknown time  . fluticasone (FLONASE) 50 MCG/ACT nasal spray Place 2 sprays into both nostrils 2 (two) times daily.   12/06/2014 at Unknown time  . fluvoxaMINE (LUVOX) 50 MG tablet Take 50 mg (one tablet) every morning  and take 100 mg (2 tablets) at bed time for depression (Patient taking differently: Take 50-100 mg by mouth 2 (two) times daily. Take 50 mg (one tablet) every morning and take 100 mg (2 tablets) at bed time for depression) 90 tablet 0 12/06/2014 at Unknown time  . LORazepam (ATIVAN) 0.5 MG tablet Take 0.5-2 mg by mouth 2 (two) times daily as needed for anxiety.   12/04/2014  . polyethylene glycol (MIRALAX / GLYCOLAX) packet Take 17 g by mouth daily.    12/05/2014  . PRESCRIPTION MEDICATION Take 3 drops by mouth 3 (three) times daily. *drops specially made - allergy drops*  Direction and amount different per week   12/06/2014  . hydrOXYzine (ATARAX/VISTARIL) 50 MG tablet Take 1 tablet (50 mg total) by mouth at bedtime as needed for anxiety (sleep). (Patient not taking: Reported on 12/06/2014) 14 tablet 0 Not Taking at Unknown time  . norethindrone-ethinyl estradiol (MICROGESTIN) 1-20 MG-MCG tablet Take 1 tablet by mouth daily. (Patient not taking: Reported on 10/10/2014) 1 Package 11 Not Taking at Unknown time    Previous Psychotropic Medications:  Multiple medications in the past. Most recently on Luvox and  Low dose Abilify. Most recently stopped xanax, as  felt was not working ( last took a few weeks ago), and Buspar , which she states did not work.  Substance Abuse History in the last 12 months:  No.- denies alcohol or drug abuse     Consequences of Substance Abuse: denies   Results for orders placed or performed during the hospital encounter of 12/06/14 (from the past 72 hour(s))  Urine Drug Screen     Status: None   Collection Time: 12/06/14  1:03 PM  Result Value Ref Range   Opiates NONE DETECTED NONE DETECTED   Cocaine NONE DETECTED NONE DETECTED   Benzodiazepines NONE DETECTED NONE DETECTED   Amphetamines NONE DETECTED NONE DETECTED   Tetrahydrocannabinol NONE DETECTED NONE DETECTED   Barbiturates NONE DETECTED NONE DETECTED    Comment:        DRUG SCREEN FOR MEDICAL PURPOSES ONLY.  IF CONFIRMATION IS NEEDED FOR ANY PURPOSE, NOTIFY LAB WITHIN 5 DAYS.        LOWEST DETECTABLE LIMITS FOR URINE DRUG SCREEN Drug Class       Cutoff (ng/mL) Amphetamine      1000 Barbiturate      200 Benzodiazepine   572 Tricyclics       620 Opiates          300 Cocaine          300 THC              50   Acetaminophen level     Status: Abnormal   Collection Time: 12/06/14  1:48 PM  Result Value Ref Range   Acetaminophen (Tylenol), Serum <10.0 (L) 10 - 30 ug/mL    Comment:        THERAPEUTIC CONCENTRATIONS VARY SIGNIFICANTLY. A RANGE OF 10-30 ug/mL MAY BE AN EFFECTIVE CONCENTRATION FOR MANY PATIENTS. HOWEVER, SOME ARE BEST TREATED AT CONCENTRATIONS OUTSIDE THIS RANGE. ACETAMINOPHEN CONCENTRATIONS >150 ug/mL AT 4 HOURS AFTER INGESTION AND >50 ug/mL AT 12 HOURS AFTER INGESTION ARE OFTEN ASSOCIATED WITH TOXIC REACTIONS.   CBC     Status: Abnormal   Collection Time: 12/06/14  1:48 PM  Result Value Ref Range   WBC 7.0 4.0 - 10.5 K/uL   RBC 3.82 (L) 3.87 - 5.11 MIL/uL   Hemoglobin 13.0 12.0 - 15.0 g/dL   HCT  39.4 36.0 - 46.0 %   MCV 103.1 (H) 78.0 - 100.0 fL   MCH 34.0 26.0 - 34.0 pg   MCHC 33.0 30.0 - 36.0 g/dL   RDW 11.9 11.5  - 15.5 %   Platelets 355 150 - 400 K/uL  Comprehensive metabolic panel     Status: Abnormal   Collection Time: 12/06/14  1:48 PM  Result Value Ref Range   Sodium 141 135 - 145 mmol/L   Potassium 4.0 3.5 - 5.1 mmol/L   Chloride 106 96 - 112 mmol/L   CO2 25 19 - 32 mmol/L   Glucose, Bld 99 70 - 99 mg/dL   BUN 14 6 - 23 mg/dL   Creatinine, Ser 0.92 0.50 - 1.10 mg/dL   Calcium 9.1 8.4 - 10.5 mg/dL   Total Protein 6.8 6.0 - 8.3 g/dL   Albumin 3.8 3.5 - 5.2 g/dL   AST 21 0 - 37 U/L   ALT 11 0 - 35 U/L   Alkaline Phosphatase 78 39 - 117 U/L   Total Bilirubin 0.2 (L) 0.3 - 1.2 mg/dL   GFR calc non Af Amer 72 (L) >90 mL/min   GFR calc Af Amer 84 (L) >90 mL/min    Comment: (NOTE) The eGFR has been calculated using the CKD EPI equation. This calculation has not been validated in all clinical situations. eGFR's persistently <90 mL/min signify possible Chronic Kidney Disease.    Anion gap 10 5 - 15  Ethanol (ETOH)     Status: None   Collection Time: 12/06/14  1:48 PM  Result Value Ref Range   Alcohol, Ethyl (B) <5 0 - 9 mg/dL    Comment:        LOWEST DETECTABLE LIMIT FOR SERUM ALCOHOL IS 11 mg/dL FOR MEDICAL PURPOSES ONLY   Salicylate level     Status: None   Collection Time: 12/06/14  1:48 PM  Result Value Ref Range   Salicylate Lvl <4.5 2.8 - 20.0 mg/dL    Observation Level/Precautions:  15 minute checks  Laboratory:  as needed   Psychotherapy:  Milieu, therapist   Medications:  Continue Abilify, continue Luvox, of note, patient states that low dose Risperidone last night was very helpful and slept better for the first time in several days , so will continue   Consultations:  As needed   Discharge Concerns:  Family stressors   Estimated LOS: 5 days   Other:     Psychological Evaluations: No   Treatment Plan Summary: Daily contact with patient to assess and evaluate symptoms and progress in treatment, Medication management, Plan inpatient psychiatric admission and  medications as above   Medical Decision Making:  Review of Psycho-Social Stressors (1), Review or order clinical lab tests (1), Established Problem, Worsening (2), Review of Medication Regimen & Side Effects (2) and Review of New Medication or Change in Dosage (2)  I certify that inpatient services furnished can reasonably be expected to improve the patient's condition.   Neita Garnet 3/29/20167:06 PM

## 2014-12-07 NOTE — [facility] Group Notes (Signed)
[facility] Group Notes:  (Nursing/MHT/Case Management/Adjunct)  Date:  12/07/2014  Time:  0930  Type of Therapy:  Nurse Education  Participation Level:  Active  Participation Quality:  Appropriate  Affect:  Appropriate  Cognitive:  Appropriate  Insight:  Appropriate  Engagement in Group:  Engaged  Modes of Intervention:  Education and Exploration  Summary of Progress/Problems:  Stephanie Lowery, Stephanie Lowery 12/07/2014, 10:18 AM

## 2014-12-07 NOTE — Progress Notes (Signed)
Pt attended spiritual care group on grief and loss facilitated by chaplain Burnis KingfisherMatthew Dyshawn Cangelosi and Counseling Intern [place]. Group opened with brief discussion and psycho-social ed around grief and loss in relationships and in relation to self - identifying life patterns, circumstances, changes that cause losses. Established group norm of speaking from own life experience. Group goal of establishing open and affirming space for members to share loss and experience with grief, normalize grief experience and provide psycho social education and grief support.  Group drew on narrative and Alderian therapeutic modalities.    Stephanie Lowery was present throughout group.  As other members were speaking of the role their faith plays in coping with grief, Stephanie Lowery mentioned the importance of her faith and described taking her bible into her safe space and reminding herself that she has a reason to live.    Stephanie Lowery, Stephanie Lowery

## 2014-12-07 NOTE — [facility] Group Notes (Signed)
[facility] LCSW Group Therapy          Overcoming Obstacles       1:15 -2:30        12/07/2014       Type of Therapy:  Group Therapy  Participation Level:  Appropriate  Participation Quality:  Appropriate  Affect:  Depressed  Cognitive:  Appropriate  Insight: Developing/Improving  Engagement in Therapy: Developing/Imprvoing  Modes of Intervention:  Discussion Exploration  Education Rapport BuildingProblem-Solving Support  Summary of Progress/Problems:  The main focus of today's group was overcoming obstacles.  Patient shared having a mental health diagnosis causes her to feel like a loser, a failure.      Patient shared if it were not for her parents she is not sure how she would make it.  Patient advised of having a counselor and an MD for medication management.   Wynn BankerHodnett, Stephanie Lowery 12/07/2014

## 2014-12-07 NOTE — [facility] Suicide Risk Assessment (Signed)
[facility]   Nursing information obtained from:    Demographic factors:   49 year old divorced female, no children, lives with parents  Current Mental Status:   see below Loss Factors:   long history of depression and anxiety, disability, family stress Historical Factors:   several prior admissions, chronic depression Risk Reduction Factors:   resilience, supportive family  Total Time spent with patient: 45 minutes Principal Problem:  MDD , without psychotic features  Diagnosis:   Patient Active Problem List   Diagnosis Date Noted  . MDD (major depressive disorder), recurrent severe, without psychosis [F33.2] 10/11/2014  . MDD (major depressive disorder), recurrent episode, severe [F33.2] 02/19/2014     Continued Clinical Symptoms:  Alcohol Use Disorder Identification Test Final Score (AUDIT): 0 The "Alcohol Use Disorders Identification Test", Guidelines for Use in Primary Care, Second Edition.  World Science writerHealth Organization [facility](WHO). Score between 0-7:  no or low risk or alcohol related problems. Score between 8-15:  moderate risk of alcohol related problems. Score between 16-19:  high risk of alcohol related problems. Score 20 or above:  warrants further diagnostic evaluation for alcohol dependence and treatment.   CLINICAL FACTORS:  49 year old female, known to Clinical research associatewriter from prior treatment, long history of depression, presenting with worsening anxiety, panic attacks and insomnia in the context of family stressors. Patient tends to decompensate easily with certain family stressors, mostly revolving around her difficult relationship with her brother. In general, her severe depression has improved overtime, and at this time anxiety is predominating clinical picture, although she does state she was having SI of driving car off the road recently.   Musculoskeletal: Strength & Muscle Tone: within normal limits Gait & Station: normal Patient leans: N/A  Psychiatric  Specialty Exam: Physical Exam  ROS  Blood pressure 126/72, pulse 113, temperature 98.6 F (37 C), temperature source Oral, resp. rate 18, height 5\' 3"  (1.6 m), weight 137 lb (62.143 kg), last menstrual period 11/25/2014.Body mass index is 24.27 kg/(m^2).  SEE ADMIT NOTE MSE   COGNITIVE FEATURES THAT CONTRIBUTE TO RISK:  Closed-mindedness    SUICIDE RISK:   Moderate:  Frequent suicidal ideation with limited intensity, and duration, some specificity in terms of plans, no associated intent, good self-control, limited dysphoria/symptomatology, some risk factors present, and identifiable protective factors, including available and accessible social support.  PLAN OF CARE: Patient will be admitted to inpatient psychiatric unit for stabilization and safety. Will provide and encourage milieu participation. Provide medication management and maked adjustments as needed.  Will follow daily.    Medical Decision Making:  Review of Psycho-Social Stressors (1), Review or order clinical lab tests (1), Established Problem, Worsening (2) and Review of Medication Regimen & Side Effects (2)  I certify that inpatient services furnished can reasonably be expected to improve the patient's condition.   COBOS, Madaline GuthrieFERNANDO 12/07/2014, 7:43 PM
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
## 2014-12-07 NOTE — Progress Notes (Signed)
Patient ID: Stephanie Lowery, female   DOB: 05/29/1966, 49 y.o.   MRN: [id number] D: client visible on the unit, interacts appropriately with staff and peers. Client reports admission was "more anxiety this time" rates anxiety tonight as "3" of 10. "I been pacing, y'all need a workout center, I'd be on that tread mill"  Client reports goal as: "get good nights rest" A: Writer provided emotional support encouraged client to report any concerns.  Staff will monitor q3115min for safety. R: Client is safe on the unit, attended group.

## 2014-12-07 NOTE — Progress Notes (Signed)
Patient ID: Stephanie Lowery, female   DOB: June 09, 1966, 49 y.o.   MRN: [id number]   Pt currently presents with a blunted affect and cooperative behavior. Per self inventory, pt rates depression at a 0, hopelessness 2 and anxiety 3. Pt's daily goal is to "sleep/anxiety" and they intend to do so by "go to groups." Pt reports good sleep, good concentration and a poor appetite.   Pt provided with medications per providers orders. Pt's labs and vitals were monitored throughout the day. Pt supported emotionally and encouraged to express concerns and questions. Pt educated on medications. Pt at home allergy medication verified with pharmacist. Pt allowed one sublingual administration a day.   Pt's safety ensured with 15 minute and environmental checks. Pt currently denies SI/HI and A/V hallucinations. Pt verbally agrees to seek staff if SI/HI or A/VH occurs and to consult with staff before acting on these thoughts.

## 2014-12-08 MED ORDER — RISPERIDONE 1 MG PO TABS
1.0000 mg | ORAL_TABLET | Freq: Every evening | ORAL | Status: DC | PRN
  Administered 2014-12-08: 1 mg via ORAL
  Filled 2014-12-08: qty 1
  Filled 2014-12-08: qty 14

## 2014-12-08 MED ORDER — ARIPIPRAZOLE 2 MG PO TABS
2.0000 mg | ORAL_TABLET | Freq: Every morning | ORAL | Status: DC
  Administered 2014-12-09: 2 mg via ORAL
  Filled 2014-12-08: qty 1
  Filled 2014-12-08: qty 14
  Filled 2014-12-08: qty 1

## 2014-12-08 NOTE — [facility] Suicide Risk Assessment (Signed)
[facility] INPATIENT:  Family/Significant Other Suicide Prevention Education  Suicide Prevention Education:  Patient Refusal for Family/Significant Other Suicide Prevention Education: The patient Stephanie Lowery has refused to provide written consent for family/significant other to be provided Family/Significant Other Suicide Prevention Education during admission and/or prior to discharge.  Physician notified.  Wynn BankerHodnett, [name] 12/08/2014, 8:44 AM

## 2014-12-08 NOTE — Plan of Care (Signed)
Problem: Ineffective individual coping Goal: STG: Patient will remain free from self harm Outcome: Progressing Pt remains safe with 15 minute checks     

## 2014-12-08 NOTE — Plan of Care (Signed)
Problem: Alteration in mood; excessive anxiety as evidenced by: Goal: LTG-Patient's behavior demonstrates decreased anxiety Goal not met. Patient is rating anxiety at four. Goal is for patient to rate anxiety at three or below on discharge.  Joette Catching, LCSW Clinical Social Worker [phone]  12/07/2014 12:27 PM    (Patient's behavior demonstrates anxiety and he/she is utilizing learned coping skills to deal with anxiety-producing situations)  Outcome: Not Progressing Pt reports increased anxiety at a 10/10. Pt refuses anxiolytic medication.

## 2014-12-08 NOTE — Progress Notes (Signed)
[facility] MD Progress Note  12/08/2014 6:00 PM Stephanie Lowery  MRN:  [id number] Subjective:  Patient continues to report significant anxiety, even in the absence of severe depression , which she has been rating as low. Complains of poor sleep. Last night did not sleep well but feels this may be related to taking Abilify at night time, and to environmental issues on the unit ( noise, other patients, etc.) She does state that she is not particularly depressed and is happy because she realizes that she " bounces back from these episodes quicker than I used to ". She also has noticed she has been more communicative over recent months/weeks, able to let  ( for example ) her brother and others know how she is feeling rather than internalizing all her feelings.  Objective :  I have discussed case with treatment team  Patient has been intermittently , vaguely irritable at times. She has described her anxiety symptoms as still high , but has denied any depression. She has been going to groups. She states she is bothered by some of her peers' vulgar language so at times prefers to stay in room. At this time not endorsing medication side effects. No EPS, no akathisia. She is feeling " better", and is hoping to be discharged soon. Sleep is poor , but states she does think Risperidone has been " really good to improve my sleep" and denies side effects.    Principal Problem: <principal problem not specified> Diagnosis:   Patient Active Problem List   Diagnosis Date Noted  . MDD (major depressive disorder), recurrent severe, without psychosis [F33.2] 10/11/2014  . MDD (major depressive disorder), recurrent episode, severe [F33.2] 02/19/2014   Total Time spent with patient: 20 minutes   Past Medical History:  Past Medical History  Diagnosis Date  . Depression   . Anxiety     Past Surgical History  Procedure Laterality Date  . Gallbladder surgery     Family History: History reviewed. No pertinent family  history. Social History:  History  Alcohol Use No     History  Drug Use No    History   Social History  . Marital Status: Single    Spouse Name: N/A  . Number of Children: N/A  . Years of Education: N/A   Social History Main Topics  . Smoking status: Never Smoker   . Smokeless tobacco: Never Used  . Alcohol Use: No  . Drug Use: No  . Sexual Activity: No   Other Topics Concern  . None   Social History Narrative   Additional History:    Sleep: Poor  Appetite:  Good   Assessment:   Musculoskeletal: Strength & Muscle Tone: within normal limits Gait & Station: normal Patient leans: N/A   Psychiatric Specialty Exam: Physical Exam  Review of Systems  Constitutional: Negative for fever and chills.  Respiratory: Negative for cough and shortness of breath.   Cardiovascular: Negative for chest pain.  Gastrointestinal: Negative for nausea, vomiting, abdominal pain and diarrhea.  Genitourinary: Negative for dysuria, urgency and frequency.  Skin: Negative for rash.  Psychiatric/Behavioral: Positive for depression. The patient is nervous/anxious.     Blood pressure 123/83, pulse 115, temperature 98.8 F (37.1 C), temperature source Oral, resp. rate 18, height  (1.6 m), weight 137 lb (62.143 kg), last menstrual period 11/25/2014.Body mass index is 24.27 kg/(m^2).  General Appearance: Well Groomed  Eye Contact::  Good  Speech:  Normal Rate  Volume:  Normal  Mood:  Euthymic  Affect:  Appropriate- denies depression, describes subjective feelings of anxiety  Thought Process:  Goal Directed and Linear  Orientation:  Full (Time, Place, and Person)  Thought Content:  no hallucinations, no delusions   Suicidal Thoughts:  No at this time denies any thoughts of hurting self and  contracts for safety on unit   Homicidal Thoughts:  No  Memory:  Recent and remote grossly intact   Judgement:  Other:  improved   Insight:  Fair  Psychomotor Activity:  Normal   Concentration:  Good  Recall:  Good  Fund of Knowledge:Good  Language: Good  Akathisia:  Negative  Handed:  Right  AIMS (if indicated):     Assets:  Communication Skills Desire for Improvement Housing Resilience Social Support  ADL's improved   Cognition: WNL  Sleep:  Number of Hours: 2.75     Current Medications: Current Facility-Administered Medications  Medication Dose Route Frequency Provider Last Rate Last Dose  . acetaminophen (TYLENOL) tablet 650 mg  650 mg Oral Q4H PRN Earney Navy, NP      . ALPRAZolam Prudy Feeler) tablet 2 mg  2 mg Oral BID PRN Earney Navy, NP      . alum & mag hydroxide-simeth (MAALOX/MYLANTA) 200-200-20 MG/5ML suspension 30 mL  30 mL Oral PRN Earney Navy, NP      . ARIPiprazole (ABILIFY) tablet 2 mg  2 mg Oral QHS Earney Navy, NP   2 mg at 12/07/14 2118  . fluticasone (FLONASE) 50 MCG/ACT nasal spray 2 spray  2 spray Each Nare BID Earney Navy, NP   2 spray at 12/08/14 1659  . fluvoxaMINE (LUVOX) tablet 100 mg  100 mg Oral QHS Craige Cotta, MD   100 mg at 12/07/14 2119  . fluvoxaMINE (LUVOX) tablet 50 mg  50 mg Oral Q breakfast Earney Navy, NP   50 mg at 12/08/14 0753  . ondansetron (ZOFRAN) tablet 4 mg  4 mg Oral Q8H PRN Earney Navy, NP      . polyethylene glycol (MIRALAX / GLYCOLAX) packet 17 g  17 g Oral Daily Earney Navy, NP   17 g at 12/08/14 0757  . risperiDONE (RISPERDAL) tablet 0.5 mg  0.5 mg Oral QHS,MR X 1 Spencer E Simon, PA-C   0.5 mg at 12/07/14 2329    Lab Results: No results found for this or any previous visit (from the past 48 hour(s)).  Physical Findings: AIMS: Facial and Oral Movements Muscles of Facial Expression: None, normal Lips and Perioral Area: None, normal Jaw: None, normal Tongue: None, normal,Extremity Movements Upper (arms, wrists, hands, fingers): None, normal Lower (legs, knees, ankles, toes): None, normal, Trunk Movements Neck, shoulders, hips: None, normal,  Overall Severity Severity of abnormal movements (highest score from questions above): None, normal Incapacitation due to abnormal movements: None, normal Patient's awareness of abnormal movements (rate only patient's report): No Awareness, Dental Status Current problems with teeth and/or dentures?: No Does patient usually wear dentures?: No  CIWA:    COWS:      Assessment:  At this time patient is improved . She denies depression. She continues to report some anxiety but seems calm and appropriate at this time. She is hoping for discharge soon. She did sleep poorly last night, which she attributes to Abilify being given at night time rather than in AM . As noted, she states Risperidone has been very good for her long history of insomnia without side effects, so will continue at 1  mgr QHS PRN for now.   Treatment Plan Summary: Daily contact with patient to assess and evaluate symptoms and progress in treatment, Medication management, Plan continue inpatient admission and continue medications as below  Abilify 2 mgrs QAM Risperidone 1 mgr QHS PRN Insomnia Luvox 150 mgrs total daily dose    Medical Decision Making:  Established Problem, Stable/Improving (1), Review of Psycho-Social Stressors (1), Review or order clinical lab tests (1) and Review of Medication Regimen & Side Effects (2)     COBOS, FERNANDO 12/08/2014, 6:00 PM

## 2014-12-08 NOTE — [facility] Group Notes (Signed)
[facility] LCSW Group Therapy  Emotional Regulation 1:15 - 2: 30 PM        12/08/2014     Type of Therapy:  Group Therapy  Participation Level:  Appropriate  Participation Quality:  Appropriate  Affect:  Appropriate  Cognitive:  Attentive Appropriate  Insight:  Developing/Improving Engaged  Engagement in Therapy:  Developing/Improving Engaged  Modes of Intervention:  Discussion Exploration Problem-Solving Supportive  Summary of Progress/Problems:  Group topic was emotional regulations.  Patient participated in the discussion and was able to identify an emotion that needed to regulated as anxiety.  Patient thanked another patient for sharing her experience of being the mother of a suicide victim.  Patient advised other patient that she saved her life as she would never want to put her mother through the agony other patient experiencing.    Wynn BankerHodnett, Deval Mroczka Hairston 12/08/2014

## 2014-12-08 NOTE — Progress Notes (Signed)
Recreation Therapy Notes  Date: 03.30.2016 Time: 9:30am Location: [address] Group Room   Group Topic: Stress Management  Goal Area(s) Addresses:  Patient will actively participate in stress management techniques presented during session.   Behavioral Response: Did not attend.   Ryleigh Buenger L Arvetta Araque, LRT/CTRS  Lalana Wachter L 12/08/2014 3:08 PM 

## 2014-12-08 NOTE — Progress Notes (Addendum)
Patient ID: Stephanie Lowery, female   DOB: 1966/02/07, 49 y.o.   MRN: [id number]   Pt currently presents with a pleasant affect and anxious, irritable behavior. Pt exhibiting intrusive behaviors. Per self inventory, pt rates depression at a 0, hopelessness 0 and anxiety at a 10. Pt's daily goal is "getting sleep to lower my anxiety" and they intend to do so by "breathing techinques." Pt reports good sleep and a poor appetite. Pt irritable today per "no sleep last night." Pt reports a tickle in her throat and racing thoughts to blame. This morning pt became upset after a pt in the milieu was "dropping g-d's and f bombs." This afternoon, pt states to writer "I don't want to eat. I'm going to stay back and sleep." Pt encouraged to go to lunch to eat and then return to nap. Pt upset by this and states "Well I'll just talk to the doctor then and let him know for tomorrow." Pt remains in dayroom and hallway throughout the day today.   Pt provided with medications per providers orders. Pt educated on medications and relaxation techniques. Pt refused anxiety medicine stating, "No ativan or xanax. I'm coming off them. If my mom tries to give them to me, I told her I would flush them down the commode!" Pt's labs and vitals were monitored throughout the day. Pt supported emotionally and encouraged to express concerns and questions.   Pt's safety ensured with 15 minute and environmental checks. Pt currently denies SI/HI and A/V hallucinations. Pt verbally agrees to seek staff if SI/HI or A/VH occurs and to consult with staff before acting on these thoughts.

## 2014-12-08 NOTE — [facility] Group Notes (Signed)
[facility]   12/08/2014 10:13 AM    Participation Quality:  Appropraite  Mood/Affect:  Appropriate  Depression Rating:  0  Anxiety Rating:  10  Thoughts of Suicide:  No  Will you contract for safety?   NA  Current AVH:  No  Plan for Discharge/Comments:  Patient attended discharge planning group and actively participated in group. She plans to return to [place] and will follow up with [facility].  Suicide prevention education reviewed and SPE document provided.   Transportation Means: Patient has transportation.   Supports:  Patient has a support system.   Wynn BankerHodnett, Chong January Hairston  12/08/2014  10:13 AM

## 2014-12-09 DIAGNOSIS — F410 Panic disorder [episodic paroxysmal anxiety] without agoraphobia: Secondary | ICD-10-CM | POA: Insufficient documentation

## 2014-12-09 MED ORDER — RISPERIDONE 1 MG PO TABS: 1.0000 mg | ORAL_TABLET | Freq: Every evening | ORAL | Status: DC | PRN

## 2014-12-09 MED ORDER — ARIPIPRAZOLE 2 MG PO TABS: 2.0000 mg | ORAL_TABLET | Freq: Every morning | ORAL | Status: DC

## 2014-12-09 MED ORDER — FLUVOXAMINE MALEATE 50 MG PO TABS: ORAL_TABLET | ORAL | Status: DC

## 2014-12-09 NOTE — Progress Notes (Signed)
Patient ID: Stephanie Lowery, female   DOB: 1966/04/26, 49 y.o.   MRN: [id number] Discharge Note-Reviewed with her re her discharge plans. She verbalized her understanding re her follow up and her medications. She has been given one week supply of her medications along with a 30 day supply as a Rx. Her parents are picking her up, and she has confirmed they are on there way, about 15 min away. All property has been returned. She denies any thoughts of suicide or homicide. She states she is happy to be going home. Escorted to lobby for discharge.

## 2014-12-09 NOTE — Progress Notes (Signed)
  [facility] :  Will you be returning to the same living situation after discharge:  Yes,  Patient is returning to her hiome. At discharge, do you have transportation home?: Yes,  Patient arranged transportation home. Do you have the ability to pay for your medications: Yes,  Patient is able to obtain medications.  Release of information consent forms completed and in the chart;  Patient's signature needed at discharge.  Patient to Follow up at: Follow-up Information    Follow [facility] with [facility]/2014.   Why:  You are scheduled with [facility] on Monday, December 13, 2014 at [facility]   Contact information:   [address] [place], [place]   [address]  [phone]      [facility] On 12/[facility]/2014.   Why:  You are scheduled to see Dr. Jama Flavorsobos on Tuesday, December 28, 2014 at 1:00 PM   Contact information:   [address] [place], [place]   [id number]  [phone]      Patient denies SI/HI: Patient no longer endorsing SI/HI or other thoughts of self harm.     Safety Planning and Suicide Prevention discussed: .Reviewed with all patients during discharge planning group   Have you used any form of tobacco in the last 30 days? (Cigarettes, Smokeless Tobacco, Cigars, and/or Pipes): No  Has patient been referred to the Quitline?: No referral needed to quitline.  Wynn BankerHodnett, Luvina Poirier Hairston 12/09/2014, 2:24 PM

## 2014-12-09 NOTE — [facility] Group Notes (Signed)
[facility] Group Notes:  (Nursing/MHT/Case Management/Adj  Date:  12/09/2014  Time:  10:36 AM  Type of Therapy:  Nurse Education  Participation Level:  Did Not Attend  Participation Quality:  did not attend  Affect:  N/A  Cognitive:  did not attend  Insight:  None  Engagement in Group:  N/A  Modes of Intervention:  N/A  Summary of Progress/Problems:  Wynona LunaBeck, Gemini Bunte K 12/09/2014, 10:36 AM

## 2014-12-09 NOTE — Progress Notes (Signed)
Patient ID: Stephanie Lowery, female   DOB: 09-27-1965, 49 y.o.   MRN: [id number] D-States she is being discharged today, and has already told her parents to pick her up here at 1400. I am unsure if this has been decided by Dr. Stann MainlandWill let him know. She is upbeat, and anxious acting, Moves and speaks quickly.Denies any thoughts to hurt self or others. A-Support offered. Monitored for safety medications as ordered. R-Asked that when she discharges I dont do her plans in front of others, doesn't want everyone to hear. No complaints at this time.

## 2014-12-09 NOTE — Progress Notes (Signed)
D: Pt presents anxious in affect and mood. Pt reports being aware of and apologetic over her previous behaviors displayed during day shift. Pt reports that she was able to review her positive coping skills. Pt is receptive to the fact that staff wants to encourage her visibility and participation within the milieu. Pt is currently denying any SI/HI/AVH. Pt is hoping that her increased dose of Risperdal will improve her sleep this evening.  A: Writer administered Risperdal, per MD orders. Continued support and availability as needed was extended to this pt. Staff continue to monitor pt with q2015min checks.  R: No adverse drug reactions noted. Pt receptive to treatment. Pt remains safe at this time.    Update: Pt verbalizes that she slept good with her increased dose of Risperdal. "Tell Dr. Jama Flavorsobos that I'm ready to go home". Pt encouraged to verbalize her desire to Dr.Cobos with plans of being compliant with maintaining her mental health.

## 2014-12-09 NOTE — [facility] Suicide Risk Assessment (Signed)
[facility]   Demographic Factors:  49 year old female , divorced, no children, lives independently, but often stays with parents, currently on disability  Total Time spent with patient: 30 minutes  Musculoskeletal: Strength & Muscle Tone: within normal limits Gait & Station: normal Patient leans: N/A  Psychiatric Specialty Exam: Physical Exam  ROS  Blood pressure 123/83, pulse 115, temperature 98.8 F (37.1 C), temperature source Oral, resp. rate 18, height 5\' 3"  (1.6 m), weight 137 lb (62.143 kg), last menstrual period 11/25/2014.Body mass index is 24.27 kg/(m^2).  General Appearance: Well Groomed  Patent attorneyye Contact::  Good  Speech:  Normal Rate, not pressured   Volume:  Normal  Mood:  Euthymic  Affect:  Appropriate, more reactive than it has been , smiles appropriately  Thought Process:  Linear  Orientation:  Other:  fully alert and attentive   Thought Content:  no hallucinations, no delusions, less ruminative about family stressors   Suicidal Thoughts:  No- denies any thoughts of wanting to hurt self or anyone else   Homicidal Thoughts:  No  Memory:   Recent and remote grossly intact   Judgement:  Other:  improved  Insight:  improved   Psychomotor Activity:  Normal  Concentration:  Good  Recall:  Good  Fund of Knowledge:Good  Language: Good  Akathisia:  Negative  Handed:  Right  AIMS (if indicated):     Assets:  Communication Skills Desire for Improvement Physical Health Resilience Social Support  Sleep:  Number of Hours: 4.5  Cognition: WNL  ADL's:  Improved    Have you used any form of tobacco in the last 30 days? (Cigarettes, Smokeless Tobacco, Cigars, and/or Pipes): No  Has this patient used any form of tobacco in the last 30 days? (Cigarettes, Smokeless Tobacco, Cigars, and/or Pipes) No  Mental Status Per Nursing Assessment::   On Admission:     Current Mental Status by Physician: At this time patient is improved compared to  admission. She is alert , attentive, well related , pleasant, cooperative , good eye contact, speech is normal, not pressured , mood is improved , and today seems euthymic, affect has been appropriate, reactive, no thought disorder, no SI or HI, no psychotic symptoms , fully alert and attentive, oriented x 3   Loss Factors:  disability, little support network other than family, strained relationship with brother  Historical Factors: Several psychiatric admissions for depression and anxiety, history of depression and history of anxiety,history of suicide attempt 1992  Risk Reduction Factors:   Sense of responsibility to family, Living with another person, especially a relative and Positive coping skills or problem solving skills  Continued Clinical Symptoms:  As noted above, improved today compared to admission, no SI, no HI, no psychotic symptoms.  Cognitive Features That Contribute To Risk:  No gross cognitive deficits noted upon discharge. Is alert , attentive, and oriented x 3   Suicide Risk:  Mild:  Suicidal ideation of limited frequency, intensity, duration, and specificity.  There are no identifiable plans, no associated intent, mild dysphoria and related symptoms, good self-control (both objective and subjective assessment), few other risk factors, and identifiable protective factors, including available and accessible social support.  Principal Problem:  MDD RECURRENT SEVERE, PANIC DISORDER  Discharge Diagnoses:  Patient Active Problem List   Diagnosis Date Noted  . MDD (major depressive disorder), recurrent severe, without psychosis [F33.2] 10/11/2014  . MDD (major depressive disorder), recurrent episode, severe [F33.2] 02/19/2014    Follow-up Information  Follow up with Marjorie Smolder - [facility] On 12/13/2014.   Why:  You are scheduled with Marjorie Smolder on Monday, December 13, 2014 at [facility] information:   [address] [place], [place]   [address]  [phone]       Follow up with Dr. Jama Flavors - [facility] On 12/28/2014.   Why:  You are scheduled to see Dr. Jama Flavors on Tuesday, December 28, 2014 at 1:00 PM   Contact information:   [address] [place], [place]   [address]  [phone]    Plans to continue TMS ( Transcranial Magnetic Treatment) in [place] , [place] ( Dr. Claudie Fisherman)   Plan Of Care/Follow-up recommendations:  Activity:  As tolerated Diet:  Regular Tests:  NA Other:  see above   Is patient on multiple antipsychotic therapies at discharge:  Yes,   Do you recommend tapering to monotherapy for antipsychotics?  No  - of note, patient has done well on low dose abilify as antidepressant augmentation and low dose risperidone for sleep. She tolerates this combination well and felt worse when we tried to taper down on Risperidone. Has Patient had three or more failed trials of antipsychotic monotherapy by history:  No  Recommended Plan for Multiple Antipsychotic Therapies: Additional reason(s) for multiple antispychotic treatment:  See above    Patient is leaving in good spirits. Parents are picking her up later today Plans to return home Follow up as above     Tzipporah Nagorski 12/09/2014, 1:07 PM

## 2014-12-09 NOTE — Discharge Summary (Signed)
Physician Discharge Summary Note  Patient:  Stephanie Lowery is an 49 y.o., female MRN:  [id number] DOB:  August 18, 1966 Patient phone:  [phone] (home)  Patient address:   [address] [place] [address],  Total Time spent with patient: 30 minutes  Date of Admission:  12/06/2014 Date of Discharge: 12/09/2014  Reason for Admission:  depression  Principal Problem: MDD (major depressive disorder), recurrent severe, without psychosis Discharge Diagnoses: Patient Active Problem List   Diagnosis Date Noted  . Panic disorder [F41.0]   . MDD (major depressive disorder), recurrent severe, without psychosis [F33.2] 10/11/2014  . MDD (major depressive disorder), recurrent episode, severe [F33.2] 02/19/2014    Musculoskeletal: Strength & Muscle Tone: within normal limits Gait & Station: normal Patient leans: N/A  Psychiatric Specialty Exam:  SEE SRA Physical Exam  Vitals reviewed.   Review of Systems  All other systems reviewed and are negative.   Blood pressure 132/84, pulse 105, temperature 98.7 F (37.1 C), temperature source Oral, resp. rate 20, height  (1.6 m), weight 62.143 kg (137 lb), last menstrual period 11/25/2014.Body mass index is 24.27 kg/(m^2).   Past Medical History:  Past Medical History  Diagnosis Date  . Depression   . Anxiety     Past Surgical History  Procedure Laterality Date  . Gallbladder surgery     Family History: History reviewed. No pertinent family history. Social History:  History  Alcohol Use No     History  Drug Use No    History   Social History  . Marital Status: Single    Spouse Name: N/A  . Number of Children: N/A  . Years of Education: N/A   Social History Main Topics  . Smoking status: Never Smoker   . Smokeless tobacco: Never Used  . Alcohol Use: No  . Drug Use: No  . Sexual Activity: No   Other Topics Concern  . None   Social History Narrative   Risk to Self: Is patient at risk for suicide?: Yes Risk to  Others:   Prior Inpatient Therapy:   Prior Outpatient Therapy:    Level of Care:  OP  Hospital Course:  Stephanie Lowery was admitted for MDD (major depressive disorder), recurrent severe, without psychosis and crisis management.  She was treated discharged with the medications listed below under Medication List.  Medical problems were identified and treated as needed.  Home medications were restarted as appropriate.  Improvement was monitored by observation and Stephanie Lowery daily report of symptom reduction.  Emotional and mental status was monitored by daily self-inventory reports completed by Stephanie Lowery and clinical staff.         Stephanie Lowery was evaluated by the treatment team for stability and plans for continued recovery upon discharge.  Stephanie Lowery motivation was an integral factor for scheduling further treatment.  Employment, transportation, bed availability, health status, family support, and any pending legal issues were also considered during her hospital stay.  She was offered further treatment options upon discharge including but not limited to Residential, Intensive Outpatient, and Outpatient treatment.  Stephanie Lowery will follow up with the services as listed below under Follow Up Information.     Upon completion of this admission the patient was both mentally and medically stable for discharge denying suicidal/homicidal ideation, auditory/visual/tactile hallucinations, delusional thoughts and paranoia.      Consults:  psychiatry  Significant Diagnostic Studies:  labs: as per ED  Discharge Vitals:   Blood pressure 132/84, pulse 105, temperature 98.7 F (37.1  C), temperature source Oral, resp. rate 20, height  (1.6 m), weight 62.143 kg (137 lb), last menstrual period 11/25/2014. Body mass index is 24.27 kg/(m^2). Lab Results:   No results found for this or any previous visit (from the past 72 hour(s)).  Physical Findings: AIMS: Facial and Oral  Movements Muscles of Facial Expression: None, normal Lips and Perioral Area: None, normal Jaw: None, normal Tongue: None, normal,Extremity Movements Upper (arms, wrists, hands, fingers): None, normal Lower (legs, knees, ankles, toes): None, normal, Trunk Movements Neck, shoulders, hips: None, normal, Overall Severity Severity of abnormal movements (highest score from questions above): None, normal Incapacitation due to abnormal movements: None, normal Patient's awareness of abnormal movements (rate only patient's report): No Awareness, Dental Status Current problems with teeth and/or dentures?: No Does patient usually wear dentures?: No  CIWA:    COWS:      See Psychiatric Specialty Exam and Suicide Risk Assessment completed by Attending Physician prior to discharge.  Discharge destination:  Home  Is patient on multiple antipsychotic therapies at discharge:  No   Has Patient had three or more failed trials of antipsychotic monotherapy by history:  No    Recommended Plan for Multiple Antipsychotic Therapies: NA     Medication List    STOP taking these medications        ALPRAZolam 0.5 MG tablet  Commonly known as:  XANAX     clobetasol cream 0.05 %  Commonly known as:  TEMOVATE     diphenhydrAMINE 25 mg capsule  Commonly known as:  BENADRYL     fluticasone 50 MCG/ACT nasal spray  Commonly known as:  FLONASE     hydrOXYzine 50 MG tablet  Commonly known as:  ATARAX/VISTARIL     LORazepam 0.5 MG tablet  Commonly known as:  ATIVAN     norethindrone-ethinyl estradiol 1-20 MG-MCG tablet  Commonly known as:  MICROGESTIN     polyethylene glycol packet  Commonly known as:  MIRALAX / GLYCOLAX     PRESCRIPTION MEDICATION      TAKE these medications      Indication   ARIPiprazole 2 MG tablet  Commonly known as:  ABILIFY  Take 1 tablet (2 mg total) by mouth every morning.   Indication:  Mood Stabilization     fluvoxaMINE 50 MG tablet  Commonly known as:  LUVOX   Take 1 tab (50 mg) in the am.  Take 2 tabs (100 mg) in the evening.   Indication:  Depression     risperiDONE 1 MG tablet  Commonly known as:  RISPERDAL  Take 1 tablet (1 mg total) by mouth at bedtime as needed (Insomnia).   Indication:  Mood Stabilization       Follow-up Information    Follow up with Marjorie Smolder - [facility] On 12/13/2014.   Why:  You are scheduled with Marjorie Smolder on Monday, December 13, 2014 at [facility] information:   [address] [place], [place]   [address]  [phone]      Follow up with Dr. Jama Flavors - [facility] On 12/28/2014.   Why:  You are scheduled to see Dr. Jama Flavors on Tuesday, December 28, 2014 at 1:00 PM   Contact information:   [address] [place], [place]   [address]  [phone]      Follow-up recommendations:  Activity:  as tol, diet as tol  Comments:  1.  Take all your medications as prescribed.  2.  Report any adverse side effects to outpatient provider.                       3.  Patient instructed to not use alcohol or illegal drugs while on prescription medicines.            4.  In the event of worsening symptoms, instructed patient to call 911, the crisis hotline or go to nearest emergency room for evaluation of symptoms.  Total Discharge Time: 30 min  Signed: Velna HatchetSheila May Agustin AGNP-BC 12/09/2014, 5:50 PM   Patient seen, Suicide Assessment Completed.  Disposition Plan Reviewed

## 2014-12-10 ENCOUNTER — Telehealth (HOSPITAL_COMMUNITY): Payer: Self-pay

## 2014-12-10 NOTE — Progress Notes (Signed)
This NP [facility] about a phone call received from this patient. Patient was reportedly upset and reporting severe insomnia and that Dr. Jama Flavorsobos reported that she was [facility] if she had trouble with her medications.  This NP contacted the pt at the request of Dr. Jama Flavorsobos and the pt reports that her medication is not helping her with sleep at all and that it was not helping inpatient but that she reported to Dr. Jama Flavorsobos that it was so that she could "get discharged sooner".    This NP called Dr. Jama Flavorsobos who decided that pt should begin Seroquel 100mg  (instant release qhs) and discontinue her Abilify 2mg  daily and Risperdal 1mg  PRN qhs at this time. [facility]. Pt instructed to do as indicated above and in full agreement. Pt to followup with Dr. Jama Flavorsobos in [facility].   Plan: Seroquel 100mg  qhs #30 (ordered) Discontinue Risperdal and Abilify Continue Luvox as written on chart at discharge  Beau FannyWithrow, John C, FNP-BC 12/10/2014      04:03PM  Case discussed with me as above

## 2014-12-13 NOTE — Progress Notes (Signed)
Patient Discharge Instructions:  After Visit Summary (AVS):   Faxed to:  12/13/14 Discharge Summary Note:   Faxed to:  12/13/14 Psychiatric Admission Assessment Note:   Faxed to:  12/13/14 Suicide Risk Assessment - Discharge Assessment:   Faxed to:  12/13/14 Faxed/Sent to the Next Level Care provider:  12/13/14 Next Level Care Provider Has Access to the EMR, 12/13/14  [facility] @ [phone] Records provided to [facility] via [facility]/Epic access.  Jerelene ReddenSheena E Edgewood, 12/13/2014, 3:16 PM

## 2014-12-28 ENCOUNTER — Ambulatory Visit (HOSPITAL_COMMUNITY): Payer: Self-pay | Admitting: Psychiatry

## 2016-03-11 IMAGING — CR DG HAND COMPLETE 3+V*R*
3 series · 3 of 3 positions shown · non-contrast
Comparison: None.

CLINICAL DATA: Patient slapped wall with open palm

EXAM:
RIGHT HAND - COMPLETE 3+ VIEW

[x hand pa right]
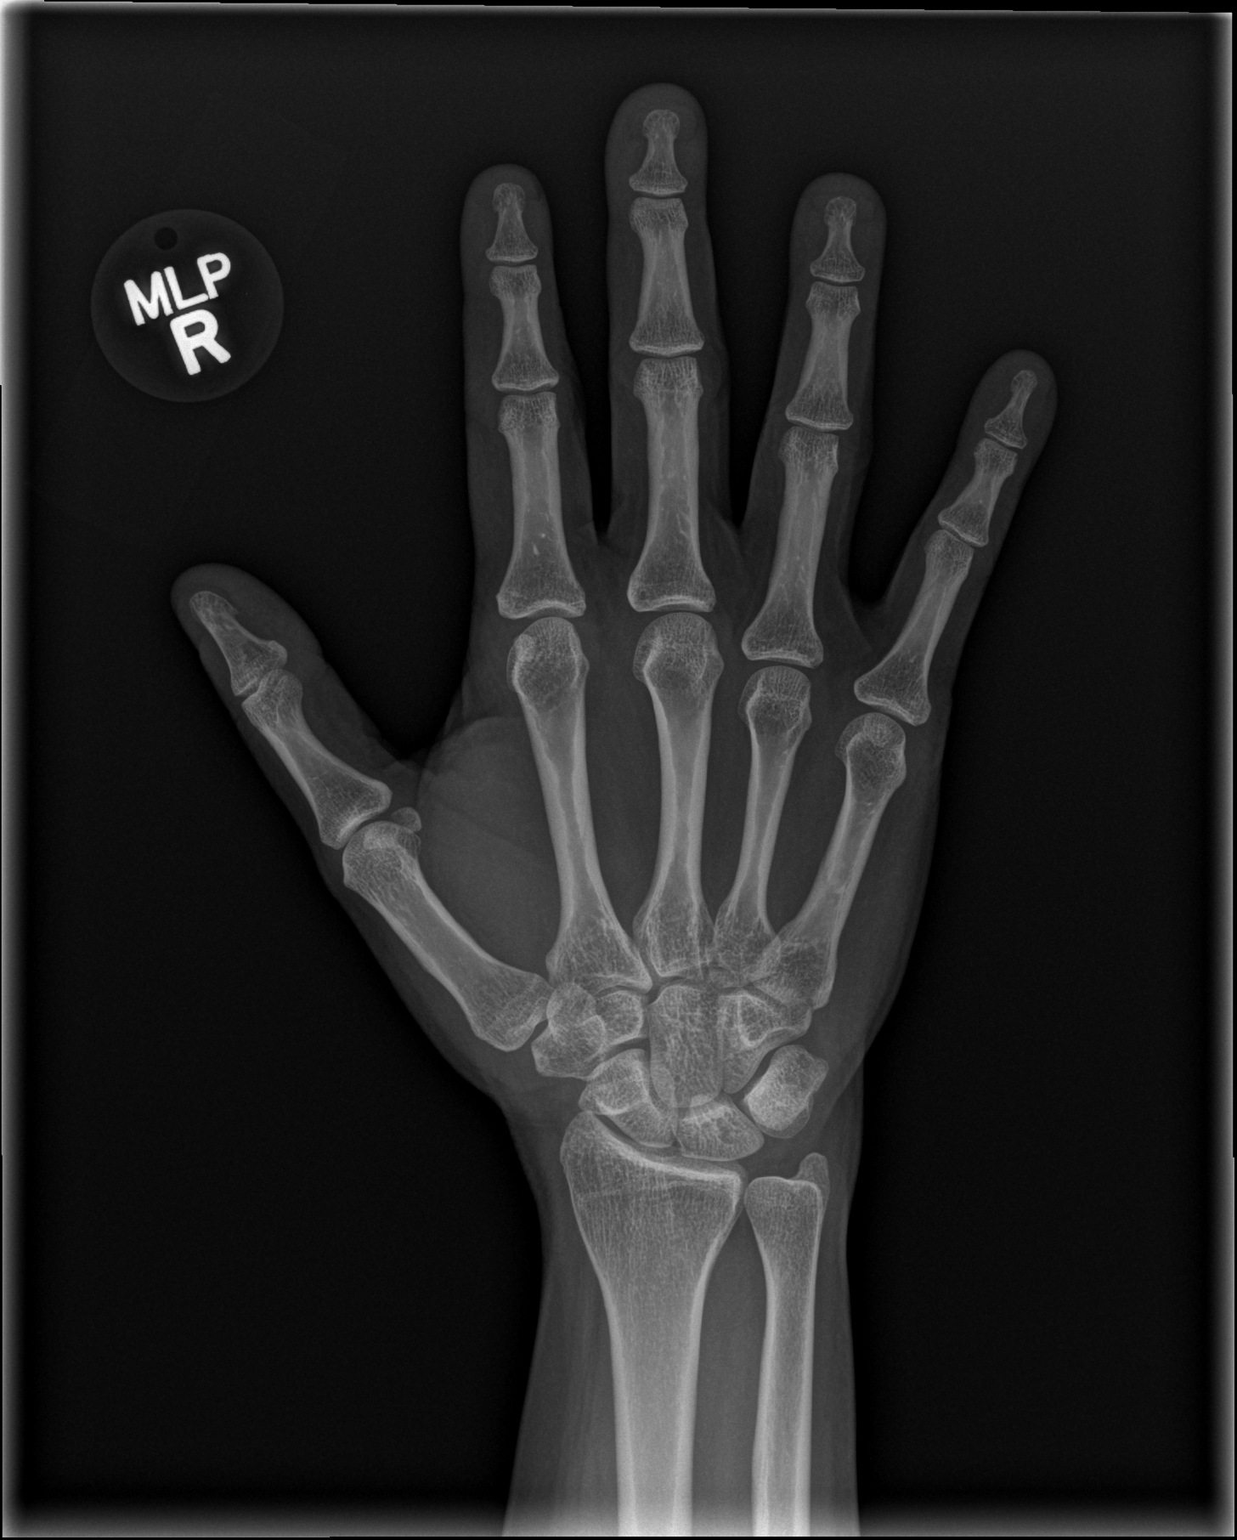

[x hand obl right]
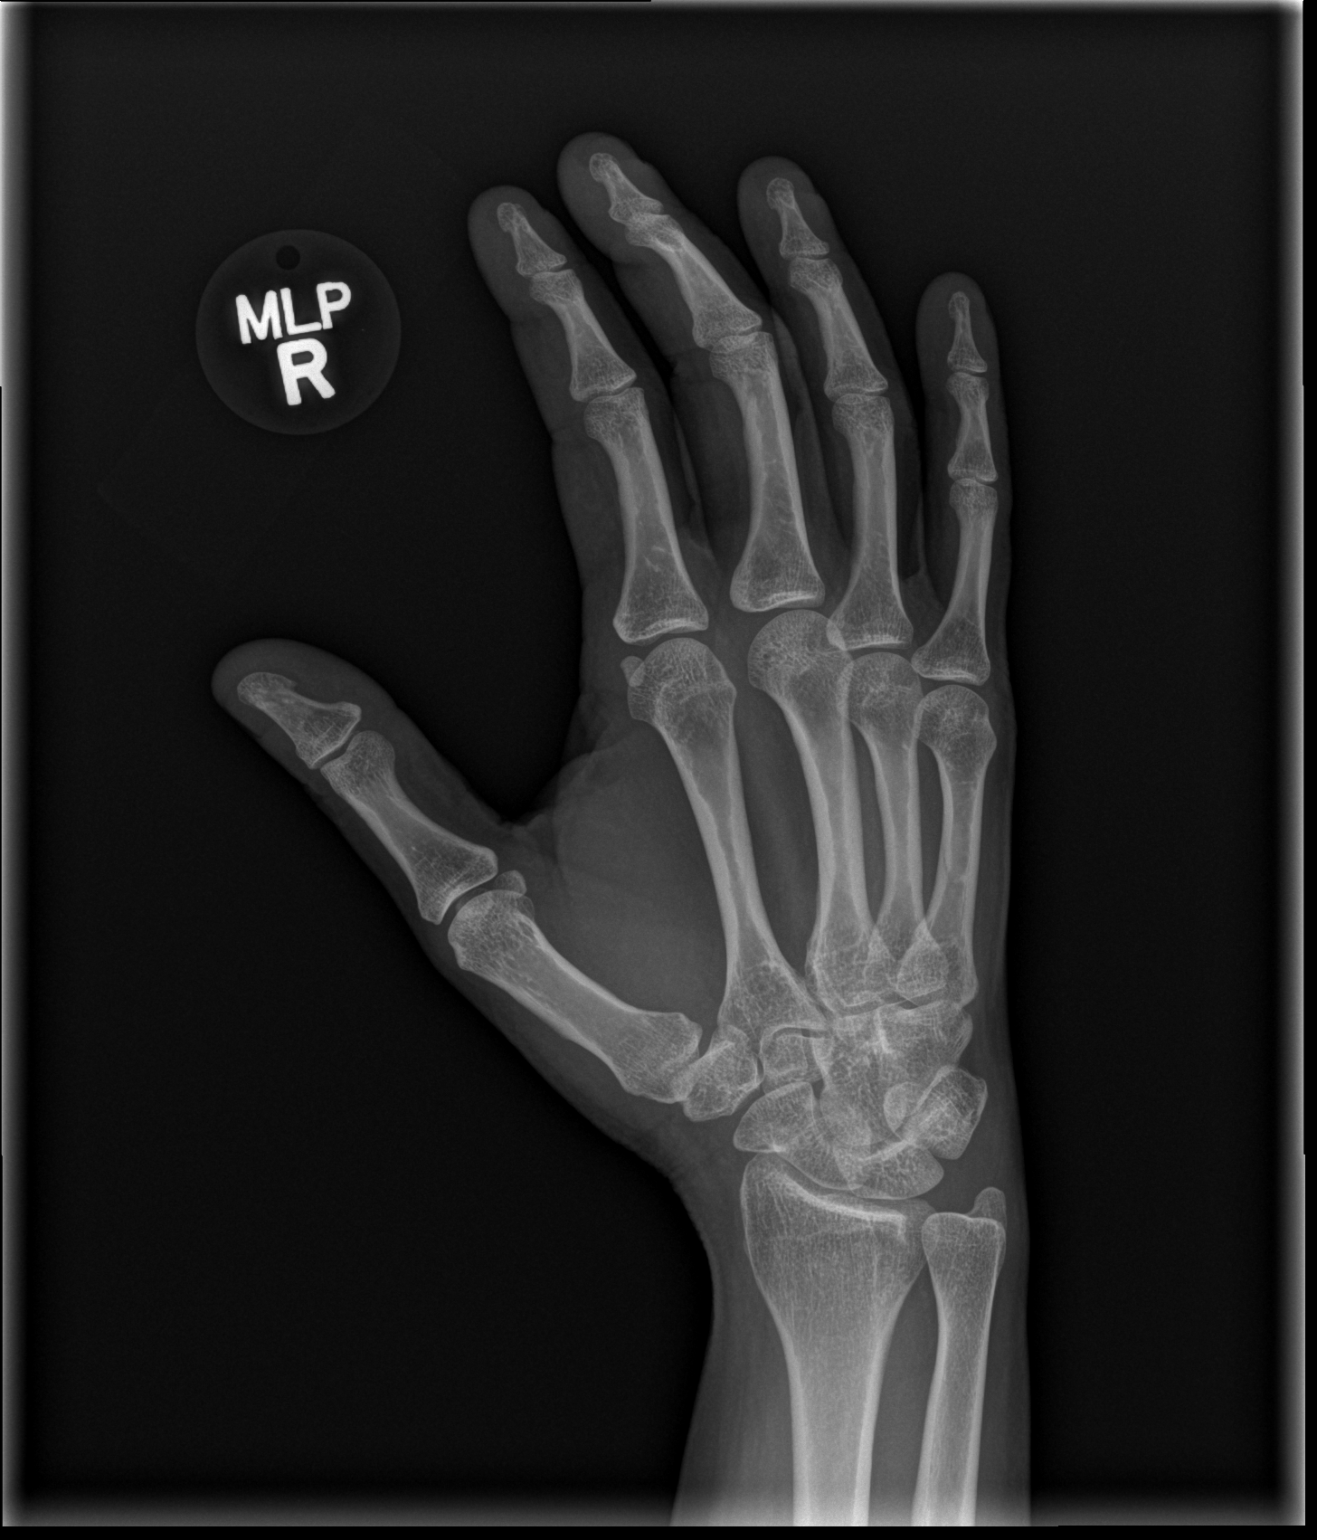

[x hand lat right]
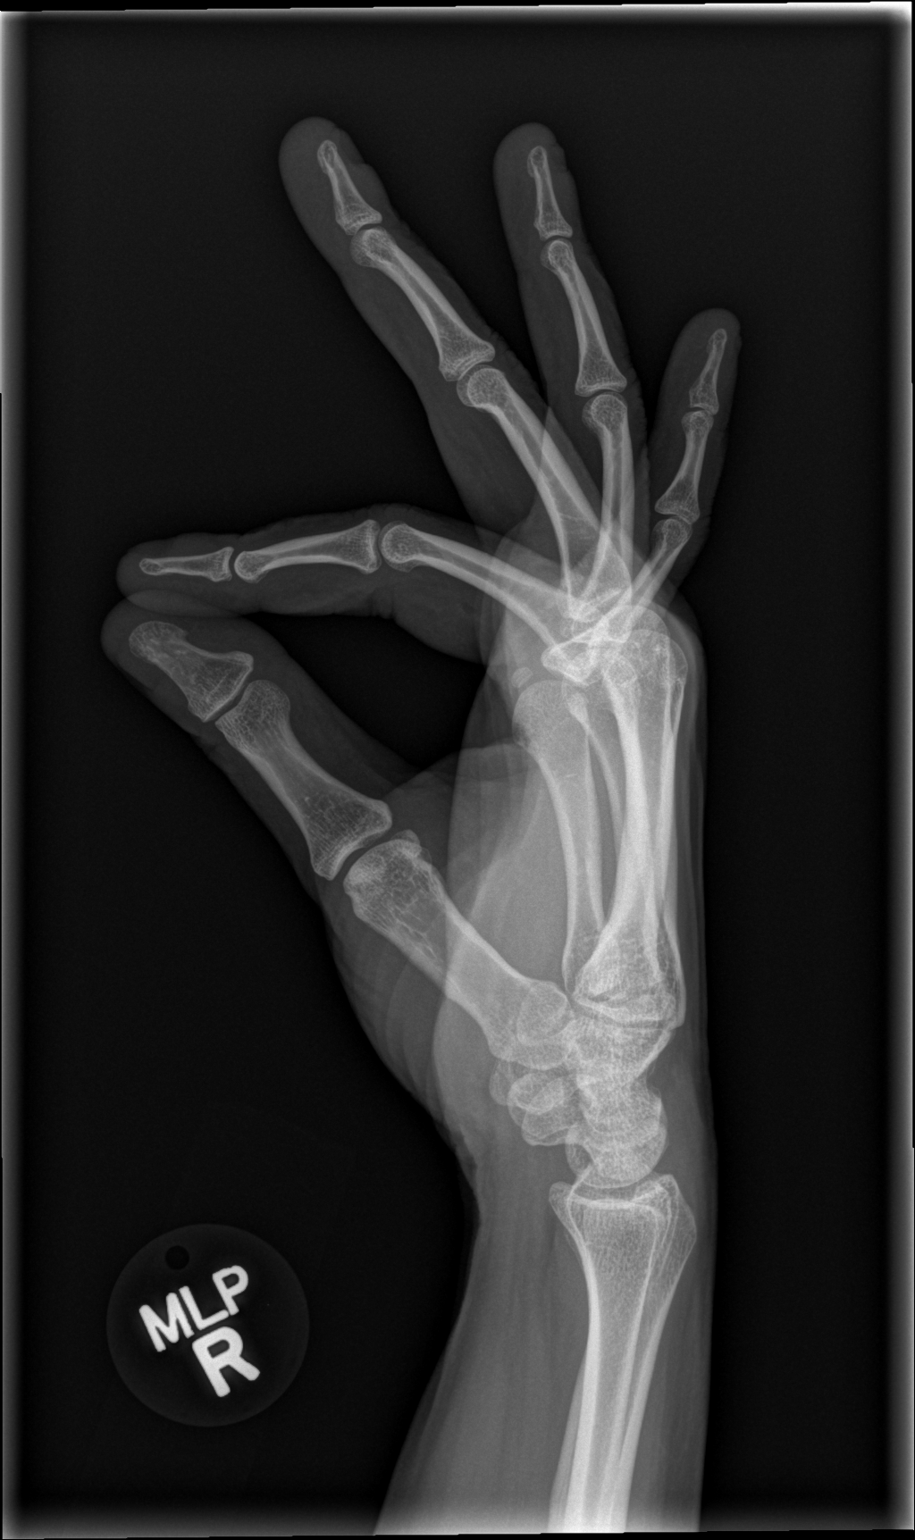

[3 of 3 positions shown; findings below may reference images not displayed]

FINDINGS: Frontal, oblique, and lateral views were obtained. There is no
demonstrable fracture or dislocation. Joint spaces appear intact. No
erosive change.
IMPRESSION: No fracture or dislocation.  No appreciable arthropathy.

## 2019-12-21 ENCOUNTER — Other Ambulatory Visit: Payer: Self-pay

## 2019-12-21 ENCOUNTER — Ambulatory Visit (HOSPITAL_COMMUNITY)
Admission: AD | Admit: 2019-12-21 | Discharge: 2019-12-21 | Disposition: A | Discharge status: Home / Self Care | Payer: Medicare Other | Source: Home / Self Care | Attending: Psychiatry | Admitting: Psychiatry

## 2019-12-21 ENCOUNTER — Emergency Department (HOSPITAL_COMMUNITY)
Admission: EM | Admit: 2019-12-21 | Discharge: 2019-12-22 | Disposition: A | Discharge status: Home / Self Care | Payer: Medicare Other | Attending: Emergency Medicine | Admitting: Emergency Medicine

## 2019-12-21 DIAGNOSIS — T424X2A Poisoning by benzodiazepines, intentional self-harm, initial encounter: Secondary | ICD-10-CM | POA: Insufficient documentation

## 2019-12-21 DIAGNOSIS — F329 Major depressive disorder, single episode, unspecified: Secondary | ICD-10-CM | POA: Insufficient documentation

## 2019-12-21 DIAGNOSIS — F332 Major depressive disorder, recurrent severe without psychotic features: Secondary | ICD-10-CM | POA: Diagnosis present

## 2019-12-21 DIAGNOSIS — T424X4A Poisoning by benzodiazepines, undetermined, initial encounter: Secondary | ICD-10-CM | POA: Insufficient documentation

## 2019-12-21 DIAGNOSIS — F319 Bipolar disorder, unspecified: Secondary | ICD-10-CM | POA: Diagnosis not present

## 2019-12-21 DIAGNOSIS — T50902A Poisoning by unspecified drugs, medicaments and biological substances, intentional self-harm, initial encounter: Secondary | ICD-10-CM

## 2019-12-21 DIAGNOSIS — X58XXXA Exposure to other specified factors, initial encounter: Secondary | ICD-10-CM | POA: Insufficient documentation

## 2019-12-21 DIAGNOSIS — F419 Anxiety disorder, unspecified: Secondary | ICD-10-CM | POA: Diagnosis present

## 2019-12-21 DIAGNOSIS — F22 Delusional disorders: Secondary | ICD-10-CM | POA: Insufficient documentation

## 2019-12-21 DIAGNOSIS — Z79899 Other long term (current) drug therapy: Secondary | ICD-10-CM | POA: Diagnosis not present

## 2019-12-21 DIAGNOSIS — Z20822 Contact with and (suspected) exposure to covid-19: Secondary | ICD-10-CM | POA: Insufficient documentation

## 2019-12-21 LAB — COMPREHENSIVE METABOLIC PANEL
ALT: 10 U/L (ref 0–44)
AST: 18 U/L (ref 15–41)
Albumin: 4.6 g/dL (ref 3.5–5.0)
Alkaline Phosphatase: 98 U/L (ref 38–126)
Anion gap: 12 (ref 5–15)
BUN: 29 mg/dL — ABNORMAL HIGH (ref 6–20)
CO2: 28 mmol/L (ref 22–32)
Calcium: 9.4 mg/dL (ref 8.9–10.3)
Chloride: 102 mmol/L (ref 98–111)
Creatinine, Ser: 1.53 mg/dL — ABNORMAL HIGH (ref 0.44–1.00)
GFR calc Af Amer: 45 mL/min — ABNORMAL LOW (ref >60)
GFR calc non Af Amer: 38 mL/min — ABNORMAL LOW (ref >60)
Glucose, Bld: 73 mg/dL (ref 70–99)
Potassium: 3.8 mmol/L (ref 3.5–5.1)
Sodium: 142 mmol/L (ref 135–145)
Total Bilirubin: 0.4 mg/dL (ref 0.3–1.2)
Total Protein: 7.7 g/dL (ref 6.5–8.1)

## 2019-12-21 LAB — CBC
HCT: 51.6 % — ABNORMAL HIGH (ref 36.0–46.0)
Hemoglobin: 16.6 g/dL — ABNORMAL HIGH (ref 12.0–15.0)
MCH: 33.6 pg (ref 26.0–34.0)
MCHC: 32.2 g/dL (ref 30.0–36.0)
MCV: 104.5 fL — ABNORMAL HIGH (ref 80.0–100.0)
Platelets: 363 10*3/uL (ref 150–400)
RBC: 4.94 MIL/uL (ref 3.87–5.11)
RDW: 11.8 % (ref 11.5–15.5)
WBC: 8.8 10*3/uL (ref 4.0–10.5)
nRBC: 0 % (ref 0.0–0.2)

## 2019-12-21 LAB — RAPID URINE DRUG SCREEN, HOSP PERFORMED
Amphetamines: NOT DETECTED
Barbiturates: NOT DETECTED
Benzodiazepines: POSITIVE — AB
Cocaine: NOT DETECTED
Opiates: NOT DETECTED
Tetrahydrocannabinol: NOT DETECTED

## 2019-12-21 LAB — RESPIRATORY PANEL BY RT PCR (FLU A&B, COVID)
Influenza A by PCR: NEGATIVE
Influenza B by PCR: NEGATIVE
SARS Coronavirus 2 by RT PCR: NEGATIVE

## 2019-12-21 LAB — ACETAMINOPHEN LEVEL: Acetaminophen (Tylenol), Serum: <10 ug/mL — ABNORMAL LOW (ref 10–30)

## 2019-12-21 LAB — ETHANOL: Alcohol, Ethyl (B): <10 mg/dL (ref <10)

## 2019-12-21 LAB — SALICYLATE LEVEL: Salicylate Lvl: <7 mg/dL — ABNORMAL LOW (ref 7.0–30.0)

## 2019-12-21 MED ORDER — ONDANSETRON HCL 4 MG/2ML IJ SOLN
4.0000 mg | Freq: Once | INTRAMUSCULAR | Status: AC
  Administered 2019-12-21: 20:00:00 4 mg via INTRAVENOUS
  Filled 2019-12-21: qty 2

## 2019-12-21 MED ORDER — QUETIAPINE FUMARATE 300 MG PO TABS
300.0000 mg | ORAL_TABLET | Freq: Every day | ORAL | Status: DC
  Administered 2019-12-22: 300 mg via ORAL
  Filled 2019-12-21: qty 1

## 2019-12-21 MED ORDER — SODIUM CHLORIDE 0.9 % IV BOLUS
1000.0000 mL | Freq: Once | INTRAVENOUS | Status: AC
  Administered 2019-12-21: 17:00:00 1000 mL via INTRAVENOUS

## 2019-12-21 MED ORDER — ACETAMINOPHEN 325 MG PO TABS: 650.0000 mg | ORAL_TABLET | ORAL | Status: DC | PRN

## 2019-12-21 MED ORDER — FLUVOXAMINE MALEATE 50 MG PO TABS
50.0000 mg | ORAL_TABLET | Freq: Every day | ORAL | Status: DC
  Administered 2019-12-22: 50 mg via ORAL
  Filled 2019-12-21 (×2): qty 1

## 2019-12-21 MED ORDER — PRAMIPEXOLE DIHYDROCHLORIDE 1 MG PO TABS
1.0000 mg | ORAL_TABLET | Freq: Every day | ORAL | Status: DC
  Administered 2019-12-22: 1 mg via ORAL
  Filled 2019-12-21 (×2): qty 1

## 2019-12-21 MED ORDER — ONDANSETRON HCL 4 MG PO TABS: 4.0000 mg | ORAL_TABLET | Freq: Three times a day (TID) | ORAL | Status: DC | PRN

## 2019-12-21 MED ORDER — GABAPENTIN 300 MG PO CAPS: 300.0000 mg | ORAL_CAPSULE | ORAL | Status: DC

## 2019-12-21 MED ORDER — SODIUM CHLORIDE 0.9 % IV BOLUS
1000.0000 mL | Freq: Once | INTRAVENOUS | Status: AC
  Administered 2019-12-21: 19:00:00 1000 mL via INTRAVENOUS

## 2019-12-21 NOTE — ED Notes (Signed)
PT standing in doorway and saying that she needs to see Dr.Cobos and let him know that she is here. PT was advised that she meets inpatient criteria at this time and currently we are awaiting placement. PT is currently alert and oriented x 4 and was understanding to fact that she was waiting on a bed assignment for the pending inpatient facility.

## 2019-12-21 NOTE — ED Triage Notes (Signed)
Arrived by EMS from [facility]. Patient reports to staff at [facility] that she took between 5-8 one MG Xanax around 1230 this afternoon. Patient reports she did this because she was "upset about something". Patient A&O X 4 on arrival to this facility. NAD

## 2019-12-21 NOTE — BH Assessment (Signed)
Assessment Note  Stephanie Lowery is an 54 y.o. female presenting voluntarily to [facility] for assessment. Patient BIB mother, Janace Hoard, who waits in lobby. Patient is visibly impaired at time of assessment as evidenced by slurred speech, disorganized thoughts, and psychomotor retardation.  For this reason she is a poor historian and it is unclear if information rendered is accurate. Patient states she needs an appointment with her doctor but cannot get an appointment. She states she only wants to work with Dr. Parke Poisson. Patient states she attempted to call her insurance company today but "the foreigners on the phone didn't know what they were talking about. This is [place]" and states something about the border. Patient initially denies SI. However even asked about previous suicide attempts states that she took 10 Xanax this morning prior to coming to [facility]. When NP asked about SI she refuses to answer. When asked about HI he states "I plead the fifth." Patient denies AVH. She denies any SA. Patient gives verbal consent for TTS to contact her mother, Chalmers Guest at [phone] for collateral if necessary.  Patient is alert and oriented to self only. She states the year is 63 and she does not know what month it is. Her speech is slurred, eye contact is poor, and thoughts are disorganized. Her mood calm/ irritable and her affect is congruent. She has poor insight, judgement and impulse control.  Diagnosis: Bipolar I (per history)  Past Medical History:  Past Medical History:  Diagnosis Date  . Anxiety   . Depression     Past Surgical History:  Procedure Laterality Date  . GALLBLADDER SURGERY      Family History: No family history on file.  Social History:  reports that she has never smoked. She has never used smokeless tobacco. She reports that she does not drink alcohol or use drugs.  Additional Social History:  Alcohol / Drug Use Pain Medications: see MAR Prescriptions: see MAR Over the Counter:  see MAR History of alcohol / drug use?: Yes  CIWA: CIWA-Ar BP: 105/77 Pulse Rate: 100 COWS:    Allergies:  Allergies  Allergen Reactions  . Ambien [Zolpidem Tartrate] Other (See Comments)    jittery  . Haldol [Haloperidol Lactate]   . Lunesta [Eszopiclone]     Jittery   . Promethazine     jittery  . Trazodone And Nefazodone Other (See Comments)    Migraines     Home Medications: (Not in a hospital admission)   OB/GYN Status:  No LMP recorded.  General Assessment Data Location of Assessment: [facility] Assessment Services TTS Assessment: In system Is this a Tele or Face-to-Face Assessment?: Face-to-Face Is this an Initial Assessment or a Re-assessment for this encounter?: Initial Assessment Patient Accompanied by:: N/A Language Other than English: No Living Arrangements: (private residence) What gender do you identify as?: Female Marital status: Single Maiden name: Roh Pregnancy Status: No Living Arrangements: Parent Can pt return to current living arrangement?: Yes Admission Status: Voluntary Is patient capable of signing voluntary admission?: No Referral Source: Other Insurance type: Facilities manager Exam ([place]) Medical Exam completed: Yes  Crisis Care Plan Living Arrangements: Parent Legal Guardian: Other:(self) Name of Psychiatrist: [facility] Name of Therapist: none  Education Status Is patient currently in school?: No Is the patient employed, unemployed or receiving disability?: Unemployed  Risk to self with the past 6 months Suicidal Ideation: Yes-Currently Present(patient refuses to answer) Has patient been a risk to self within the past 6 months prior to  admission? : Yes Suicidal Intent: No Has patient had any suicidal intent within the past 6 months prior to admission? : (UTA) Is patient at risk for suicide?: Yes Suicidal Plan?: No-Not Currently/Within Last 6 Months Has patient had any suicidal plan within  the past 6 months prior to admission? : Yes Access to Means: Yes Specify Access to Suicidal Means: precription meds What has been your use of drugs/alcohol within the last 12 months?: denies Previous Attempts/Gestures: Yes How many times?: (UTA) Other Self Harm Risks: none Triggers for Past Attempts: None known Intentional Self Injurious Behavior: None Family Suicide History: No Recent stressful life event(s): (UTA) Persecutory voices/beliefs?: No Depression: Yes Depression Symptoms: Despondent, Insomnia, Tearfulness, Isolating, Fatigue, Guilt, Loss of interest in usual pleasures, Feeling worthless/self pity, Feeling angry/irritable Substance abuse history and/or treatment for substance abuse?: No Suicide prevention information given to non-admitted patients: Not applicable  Risk to Others within the past 6 months Homicidal Ideation: (UTA) Does patient have any lifetime risk of violence toward others beyond the six months prior to admission? : No Thoughts of Harm to Others: (UTA) Current Homicidal Intent: (UTA) Current Homicidal Plan: (UTA) Access to Homicidal Means: No Identified Victim: none History of harm to others?: (UTA) Assessment of Violence: None Noted Violent Behavior Description: UTA Does patient have access to weapons?: No Criminal Charges Pending?: No Does patient have a court date: No Is patient on probation?: No  Psychosis Hallucinations: None noted Delusions: None noted  Mental Status Report Appearance/Hygiene: Unremarkable Eye Contact: Poor Motor Activity: Unsteady Speech: Slurred, Slow Level of Consciousness: Drowsy Mood: Suspicious Affect: Sullen Anxiety Level: None Thought Processes: Coherent, Relevant Judgement: Impaired Orientation: Person, Place, Time, Situation Obsessive Compulsive Thoughts/Behaviors: None  Cognitive Functioning Concentration: Poor Memory: Unable to Assess Is patient IDD: No Insight: Poor Impulse Control: Poor Appetite:  Poor Have you had any weight changes? : (UTA) Sleep: Unable to Assess Total Hours of Sleep: (UTA) Vegetative Symptoms: Unable to Assess  ADLScreening [facility] Assessment Services) Patient's cognitive ability adequate to safely complete daily activities?: Yes Patient able to express need for assistance with ADLs?: Yes Independently performs ADLs?: Yes (appropriate for developmental age)  Prior Inpatient Therapy Prior Inpatient Therapy: Yes Prior Therapy Dates: 2016, 2015 Prior Therapy Facilty/Provider(s): [facility] Reason for Treatment: bipolar I  Prior Outpatient Therapy Prior Outpatient Therapy: Yes Prior Therapy Dates: ongoing Prior Therapy Facilty/Provider(s): [facility] Reason for Treatment: med mangement Does patient have an ACCT team?: No Does patient have Intensive In-House Services?  : No Does patient have [facility] services? : No Does patient have P4CC services?: No  ADL Screening (condition at time of admission) Patient's cognitive ability adequate to safely complete daily activities?: Yes Is the patient deaf or have difficulty hearing?: No Does the patient have difficulty seeing, even when wearing glasses/contacts?: No Does the patient have difficulty concentrating, remembering, or making decisions?: No Patient able to express need for assistance with ADLs?: Yes Does the patient have difficulty dressing or bathing?: No Independently performs ADLs?: Yes (appropriate for developmental age) Does the patient have difficulty walking or climbing stairs?: No Weakness of Legs: None Weakness of Arms/Hands: None  Home Assistive Devices/Equipment Home Assistive Devices/Equipment: None  Therapy Consults (therapy consults require a physician order) PT Evaluation Needed: No OT Evalulation Needed: No SLP Evaluation Needed: No Abuse/Neglect Assessment (Assessment to be complete while patient is alone) Abuse/Neglect Assessment Can Be Completed: Unable to assess, patient is  non-responsive or altered mental status Values / Beliefs Cultural Requests During Hospitalization: None Spiritual Requests  During Hospitalization: None Consults Spiritual Care Consult Needed: No Transition of Care Team Consult Needed: No Advance Directives (For Healthcare) Does Patient Have a Medical Advance Directive?: Unable to assess, patient is non-responsive or altered mental status          Disposition: Marciano Sequin, PMHNP recommends inpatient treatment once medically cleared. Patient to be transported to [facility] ED via EMS. This counselor to call report. Disposition Initial Assessment Completed for this Encounter: Yes Disposition of Patient: Movement to [facility] or [facility] ED Patient refused recommended treatment: No  On Site Evaluation by:   Reviewed with Physician:    Celedonio Miyamoto 12/21/2019 3:03 PM

## 2019-12-21 NOTE — Progress Notes (Signed)
Patient meets inpatient criteria per Marciano Sequin, NP. Patient has been faxed out to the following facilities for review:   [facility]  [facility]  [facility]  [facility]  [facility]  CSW will continue to follow and seek bed placement.   Drucilla Schmidt, MSW, LCSW-A Clinical Disposition Social Worker [facility]/TTS [phone]

## 2019-12-21 NOTE — H&P (Signed)
Behavioral Health Medical Screening Exam  Stephanie Lowery is an 54 y.o. female with history of depression and anxiety. She is presenting after overdose on unknown number of Xanax. She told counselor that she had taken 10 Xanax. When asked again by this writer how many Xanax she took, she pauses and says "maybe 6 or 7." She states she takes Xanax 1 mg tabs PRN but denies daily use. She is sluggish, impaired, and disorganized on assessment. When asked what made her overdose, she states, "This farmer says the co-pay's out of network." When asked if overdose was suicidal in intent, she states, "I'd rather not answer that. I'm here to see Dr. Jama Flavors. He already knows I'm here." She is oriented to place but states the year is 1921 and unable to say the month.    Total Time spent with patient: 15 minutes  Psychiatric Specialty Exam: Physical Exam  Nursing note and vitals reviewed. Constitutional: She appears well-developed and well-nourished. She appears lethargic.  Cardiovascular: Normal rate.  Respiratory: Effort normal.  Neurological: She appears lethargic. She is disoriented (to time).    Review of Systems  Constitutional: Negative.   Respiratory: Negative for cough and shortness of breath.   Psychiatric/Behavioral: Positive for confusion and dysphoric mood. Negative for agitation, behavioral problems and hallucinations. The patient is nervous/anxious. The patient is not hyperactive.     Blood pressure 105/77, pulse 100, temperature 97.7 F (36.5 C), temperature source Oral, resp. rate 20, SpO2 100 %.There is no height or weight on file to calculate BMI.  General Appearance: Disheveled  Eye Contact:  Fair  Speech:  Slow  Volume:  Decreased  Mood:  Dysphoric  Affect:  Congruent  Thought Process:  Disorganized  Orientation:  Other:  oriented to person and place, not time  Thought Content:  Delusions  Suicidal Thoughts:  refuses to answer  Homicidal Thoughts:  refuses to answer  Memory:   Immediate;   Poor Recent;   Poor  Judgement:  Impaired  Insight:  Lacking  Psychomotor Activity:  Decreased  Concentration: Concentration: Poor and Attention Span: Poor  Recall:  Poor  Fund of Knowledge:Fair  Language: Fair  Akathisia:  No  Handed:  Right  AIMS (if indicated):     Assets:  Resilience Social Support  Sleep:       Musculoskeletal: Strength & Muscle Tone: within normal limits Gait & Station: normal Patient leans: N/A  Blood pressure 105/77, pulse 100, temperature 97.7 F (36.5 C), temperature source Oral, resp. rate 20, SpO2 100 %.  Recommendations:  Based on my evaluation the patient appears to have an emergency medical condition for which I recommend the patient be transferred to the emergency department for further evaluation.  Send to ED for medical evaluation for overdose, followed by inpatient psychiatric hospitalization.  Aldean Baker, NP 12/21/2019, 2:49 PM

## 2019-12-21 NOTE — ED Notes (Signed)
Pt dressed in burgundy scrubs per RN. Pt given warm blankets and requested to contact family. RN notified.

## 2019-12-21 NOTE — ED Provider Notes (Signed)
[facility] DEPT Provider Note   CSN: [id number] Arrival date & time: 12/21/19  1512    History Chief Complaint  Patient presents with  . Drug Overdose    Stephanie Lowery is a 54 y.o. female with a history of anxiety & depression who presents to the ED from [facility] for evaluation s/p intentional overdose this AM. Patient states that she took approximately 7-8 tablets of 1mg  Xanax at 11AM, per note review appears she told [facility] 10 tablets then subsequently 6-7 tablets. She states she did this with intent to harm herself. She states she has had increased depression related to family stressors. Otherwise no alleviating/aggravating factors. After taking these medications she became nauseated and had about 3 episodes of emesis. She went to [facility] for evaluation and to see her psychiatrist and was referred to the ED for medical clearance. She has had vague thoughts of HI. Currently she feels tired and a bit out of it, otherwise no current sxs. Denies hallucinations. Denies co-ingestion today. She is typically prescribed xanax PRN, she did not take any of her other medications today. Denies chest pain, dyspnea, abdominal pain, or syncope. Denies alcohol or drug use.   HPI     Past Medical History:  Diagnosis Date  . Anxiety   . Depression     Patient Active Problem List   Diagnosis Date Noted  . Panic disorder   . 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 MDD (major depressive disorder), recurrent episode, severe ([place]) 02/19/2014    Past Surgical History:  Procedure Laterality Date  . GALLBLADDER SURGERY       OB History   No obstetric history on file.     No family history on file.  Social History   Tobacco Use  . Smoking status: Never Smoker  . Smokeless tobacco: Never Used  Substance Use Topics  . Alcohol use: No  . Drug use: No    Home Medications Prior to Admission medications   Medication Sig  Start Date End Date Taking? Authorizing Provider  ARIPiprazole (ABILIFY) 2 MG tablet Take 1 tablet (2 mg total) by mouth every morning. 12/09/14   Kerrie Buffalo, NP  fluvoxaMINE (LUVOX) 50 MG tablet Take 1 tab (50 mg) in the am.  Take 2 tabs (100 mg) in the evening. 12/09/14   Kerrie Buffalo, NP  risperiDONE (RISPERDAL) 1 MG tablet Take 1 tablet (1 mg total) by mouth at bedtime as needed (Insomnia). 12/09/14   Kerrie Buffalo, NP    Allergies    Ambien [zolpidem tartrate], Haldol [haloperidol lactate], Lunesta [eszopiclone], Promethazine, and Trazodone and nefazodone  Review of Systems   Review of Systems  Constitutional: Positive for fatigue Thomos Lemons). Negative for chills and fever.  Respiratory: Negative for shortness of breath.   Cardiovascular: Negative for chest pain.  Gastrointestinal: Positive for nausea and vomiting. Negative for abdominal pain.  Genitourinary: Negative for dysuria.  Neurological: Negative for syncope.  Psychiatric/Behavioral: Positive for self-injury and suicidal ideas. Negative for hallucinations.  All other systems reviewed and are negative.   Physical Exam Updated Vital Signs BP 113/74 (BP Location: Right Arm)   Pulse 72   Temp 97.7 F (36.5 C) (Oral)   Resp 16   Ht 5\' 3"  (1.6 m)   Wt 54 kg   SpO2 100%   BMI 21.08 kg/m   Physical Exam Vitals and nursing note reviewed.  Constitutional:      General: She is not in acute distress.  Appearance: She is well-developed. She is not toxic-appearing.  HENT:     Head: Normocephalic and atraumatic.  Eyes:     General:        Right eye: No discharge.        Left eye: No discharge.     Conjunctiva/sclera: Conjunctivae normal.  Cardiovascular:     Rate and Rhythm: Normal rate and regular rhythm.  Pulmonary:     Effort: Pulmonary effort is normal. No respiratory distress.     Breath sounds: Normal breath sounds. No wheezing, rhonchi or rales.  Abdominal:     General: There is no distension.      Palpations: Abdomen is soft.     Tenderness: There is no abdominal tenderness.  Musculoskeletal:     Cervical back: Neck supple.  Skin:    General: Skin is warm and dry.     Findings: No rash.  Neurological:     Mental Status: She is alert.     Comments:  Seems a bit tired and slow to respond but is alert with clear speech.   Psychiatric:        Thought Content: Thought content includes homicidal and suicidal ideation. Thought content includes suicidal plan. Thought content does not include homicidal plan.    ED Results / Procedures / Treatments   Labs (all labs ordered are listed, but only abnormal results are displayed) Labs Reviewed  COMPREHENSIVE METABOLIC PANEL - Abnormal; Notable for the following components:      Result Value   BUN 29 (*)    Creatinine, Ser 1.53 (*)    GFR calc non Af Amer 38 (*)    GFR calc Af Amer 45 (*)    All other components within normal limits  CBC - Abnormal; Notable for the following components:   Hemoglobin 16.6 (*)    HCT 51.6 (*)    MCV 104.5 (*)    All other components within normal limits  SALICYLATE LEVEL - Abnormal; Notable for the following components:   Salicylate Lvl <7.0 (*)    All other components within normal limits  ACETAMINOPHEN LEVEL - Abnormal; Notable for the following components:   Acetaminophen (Tylenol), Serum <10 (*)    All other components within normal limits  RESPIRATORY PANEL BY RT PCR (FLU A&B, COVID)  ETHANOL  RAPID URINE DRUG SCREEN, HOSP PERFORMED  HCG, SERUM, QUALITATIVE    EKG None  Radiology No results found.  Procedures Procedures (including critical care time)  Medications Ordered in ED Medications - No data to display  ED Course  I have reviewed the triage vital signs and the nursing notes.  Pertinent labs & imaging results that were available during my care of the patient were reviewed by me and considered in my medical decision making (see chart for details).    Stephanie Lowery was  evaluated in Emergency Department on 12/21/2019 for the symptoms described in the history of present illness. He/she was evaluated in the context of the global COVID-19 pandemic, which necessitated consideration that the patient might be at risk for infection with the SARS-CoV-2 virus that causes COVID-19. Institutional protocols and algorithms that pertain to the evaluation of patients at risk for COVID-19 are in a state of rapid change based on information released by regulatory bodies including the CDC and federal and state organizations. These policies and algorithms were followed during the patient's care in the ED.  MDM Rules/Calculators/A&P  Patient presents to the ED status post intentional overdose on 6-10 mg of Xanax at 11 AM this morning.  She did have 3 episodes of emesis since overdose.  She currently feels a bit tired and somewhat slow to respond but is alert and oriented.  Physical exam is otherwise benign.  16:11: CONSULT: Discussed with poison control - CNS & respiratory depression risks- typically develop within 4 hours of ingestion, if no significant sxs currently and remains asymptomatic with reassuring labs can medically clear.   Labs ordered, reviewed, and interpreted: CBC: Hemoglobin/hematocrit are elevated, suspect degree of dehydration, fluids running.  No leukocytosis or anemia. CMP: Creatinine elevated at 1.43 and BUN elevated at 29, this is increased from prior 0.92 and 14 respectively however last on record was from 5 years prior, 2 L of normal saline ordered in the emergency department.  No significant electrolyte derangement. Salicylate/ethanol/acetaminophen levels: WNL  19:15: RE-EVAL: Patient is tolerating p.o., she states she does feel somewhat nauseated currently, will give Zofran and reassess.  No signs of CNS or respiratory depression on reevaluation.  20:05: RE-EVAL: Patient feeling much better, back to baseline, tolerating PO. Remains without  respiratory/CNS depression. Re-discussed with poison control, patient medically cleared. Consult placed to TTS. Disposition per [facility]   Patient recommended for inpatient, pending placement, should patient attempt to leave would IVC.   Final Clinical Impression(s) / ED Diagnoses Final diagnoses:  Intentional drug overdose, initial encounter [facility])    Rx / DC Orders ED Discharge Orders    None       Cherly Anderson, PA-C 12/21/19 2340    Arby Barrette, MD 12/28/19 [phone]

## 2019-12-21 NOTE — ED Notes (Signed)
Patient given meal tray.

## 2019-12-22 ENCOUNTER — Encounter (HOSPITAL_COMMUNITY): Payer: Self-pay | Admitting: Registered Nurse

## 2019-12-22 DIAGNOSIS — F332 Major depressive disorder, recurrent severe without psychotic features: Secondary | ICD-10-CM

## 2019-12-22 DIAGNOSIS — F419 Anxiety disorder, unspecified: Secondary | ICD-10-CM

## 2019-12-22 NOTE — ED Notes (Signed)
Pt provided with toothbrush and toothpaste

## 2019-12-22 NOTE — Consult Note (Signed)
[facility] ED Discharge  12/22/2019 11:57 AM Stephanie Lowery  MRN:  [id number] Principal Problem: MDD (major depressive disorder), recurrent severe, without psychosis (HCC) Discharge Diagnoses: Principal Problem:   MDD (major depressive disorder), recurrent severe, without psychosis (HCC) Active Problems:   Anxiety   Subjective: "I just need to see Dr. Jama Flavors"  Assessment:  Stephanie Lowery, 54 y.o., female patient seen via tele psych by this provider, Dr. Lucianne Muss; and chart reviewed on 12/22/19.  Patient presented as walk in at [facility] reporting she had overdosed on Xanax; then sent to [facility] ED for medical clearance.  On evaluation Stephanie Lowery reports that she wanted to see Dr. Jama Flavors to have her medication increased related to not being able to get in touch with her primary psychiatrist at [facility] in [facility].  Patient denies suicidal/self-harm/homicidal ideation, psychosis, and paranoia.  States that she would also like to start therapy.  Patient lives with family whom she states is very supportive. Patient also denies overdose   During evaluation Stephanie Lowery is alert/oriented x 4; calm/cooperative; and mood is congruent with affect.  She does not appear to be responding to internal/external stimuli or delusional thoughts.  Patient denies suicidal/self-harm/homicidal ideation, psychosis, and paranoia.  Patient answered question appropriately.  Discussed calling to set up an appointment with psychiatrist for medication management and asking that therapy be added on.  Also informed that a lot of outpatient providers were doing visits virtually related to Covid 19; understanding voiced.  Informed could not admit to acute hospital just to see Dr. Jama Flavors for medication increase.     Total Time spent with patient: 30 minutes  Past Psychiatric History: See above  Past Medical History:  Past Medical History:  Diagnosis Date  . Anxiety   . Depression     Past Surgical History:   Procedure Laterality Date  . GALLBLADDER SURGERY     Family History: History reviewed. No pertinent family history. Family Psychiatric  History: Unaware Social History:  Social History   Substance and Sexual Activity  Alcohol Use No     Social History   Substance and Sexual Activity  Drug Use No    Social History   Socioeconomic History  . Marital status: Single    Spouse name: Not on file  . Number of children: Not on file  . Years of education: Not on file  . Highest education level: Not on file  Occupational History  . Not on file  Tobacco Use  . Smoking status: Never Smoker  . Smokeless tobacco: Never Used  Substance and Sexual Activity  . Alcohol use: No  . Drug use: No  . Sexual activity: Never  Other Topics Concern  . Not on file  Social History Narrative  . Not on file   Social Determinants of Health   Financial Resource Strain:   . Difficulty of Paying Living Expenses:   Food Insecurity:   . Worried About Programme researcher, broadcasting/film/video in the Last Year:   . Barista in the Last Year:   Transportation Needs:   . Freight forwarder (Medical):   Marland Kitchen Lack of Transportation (Non-Medical):   Physical Activity:   . Days of Exercise per Week:   . Minutes of Exercise per Session:   Stress:   . Feeling of Stress :   Social Connections:   . Frequency of Communication with Friends and Family:   . Frequency of Social Gatherings with Friends and Family:   .  Attends Religious Services:   . Active Member of Clubs or Organizations:   . Attends Banker Meetings:   Marland Kitchen Marital Status:     Has this patient used any form of tobacco in the last 30 days? (Cigarettes, Smokeless Tobacco, Cigars, and/or Pipes) Prescription not provided because: does not use tobacco products  Current Medications: Current Facility-Administered Medications  Medication Dose Route Frequency Provider Last Rate Last Admin  . acetaminophen (TYLENOL) tablet 650 mg  650 mg Oral Q4H  PRN Petrucelli, Samantha R, PA-C      . fluvoxaMINE (LUVOX) tablet 50 mg  50 mg Oral QHS Petrucelli, Samantha R, PA-C   50 mg at 12/22/19 0019  . gabapentin (NEURONTIN) capsule 300-600 mg  300-600 mg Oral See admin instructions Petrucelli, Samantha R, PA-C      . ondansetron (ZOFRAN) tablet 4 mg  4 mg Oral Q8H PRN Petrucelli, Samantha R, PA-C      . pramipexole (MIRAPEX) tablet 1 mg  1 mg Oral QHS Petrucelli, Samantha R, PA-C   1 mg at 12/22/19 0019  . QUEtiapine (SEROQUEL) tablet 300 mg  300 mg Oral QHS Petrucelli, Samantha R, PA-C   300 mg at 12/22/19 0020   Current Outpatient Medications  Medication Sig Dispense Refill  . cetirizine-pseudoephedrine (ZYRTEC-D) 5-120 MG tablet Take 1 tablet by mouth 2 (two) times daily.    . fluvoxaMINE (LUVOX) 50 MG tablet Take 1 tab (50 mg) in the am.  Take 2 tabs (100 mg) in the evening. (Patient taking differently: Take 50 mg by mouth at bedtime. Take 1 tab (50 mg) in the am.  Take 2 tabs (100 mg) in the evening.) 30 tablet 0  . gabapentin (NEURONTIN) 300 MG capsule Take 300-600 mg by mouth See admin instructions. Take 300mg  in the AM and 600mg  at bedtime.    . hydrOXYzine (ATARAX/VISTARIL) 50 MG tablet Take 100 mg by mouth every 6 (six) hours as needed for anxiety.    . pramipexole (MIRAPEX) 1 MG tablet Take 1 mg by mouth at bedtime. Takes for restless legs syndrome    . QUEtiapine (SEROQUEL) 300 MG tablet Take 300 mg by mouth at bedtime.    . triamcinolone cream (KENALOG) 0.5 % Apply 1 application topically 2 (two) times daily as needed for itching.     PTA Medications: (Not in a hospital admission)   Musculoskeletal: Strength & Muscle Tone: within normal limits Gait & Station: normal Patient leans: N/A  Psychiatric Specialty Exam: Physical Exam Vitals and nursing note reviewed. Exam conducted with a chaperone present (Sitter at bedside).  Constitutional:      Appearance: Normal appearance.  Pulmonary:     Effort: Pulmonary effort is normal.   Musculoskeletal:        General: Normal range of motion.  Neurological:     Mental Status: She is alert.  Psychiatric:        Attention and Perception: Attention and perception normal.        Mood and Affect: Mood normal.        Speech: Speech normal.        Behavior: Behavior normal. Behavior is cooperative.        Thought Content: Thought content normal. Thought content is not paranoid or delusional. Thought content does not include homicidal or suicidal ideation.        Cognition and Memory: Cognition and memory normal.        Judgment: Judgment normal.     Review of Systems  Psychiatric/Behavioral:  Negative for agitation, confusion, hallucinations, self-injury, sleep disturbance and suicidal ideas. Nervous/anxious: Stable.        Patient stating she has outpatient services and prescribed Luvox but feel it needs to be increased; Has been unable to get in touch with provider at [facility] to have increased.  "Dr. Parke Poisson said anytime I needed anything I could come see him.  I just need to get my medication increased.  He is my life savior."  All other systems reviewed and are negative.   Blood pressure 97/70, pulse (!) 53, temperature 97.7 F (36.5 C), temperature source Oral, resp. rate 14, height 5\' 3"  (1.6 m), weight 54 kg, last menstrual period 11/25/2014, SpO2 96 %.Body mass index is 21.08 kg/m.  General Appearance: Casual  Eye Contact:  Good  Speech:  Clear and Coherent and Normal Rate  Volume:  Normal  Mood:  Anxious and Depressed:  stable  Affect:  Appropriate and Congruent  Thought Process:  Coherent  Orientation:  Full (Time, Place, and Person)  Thought Content:  WDL  Suicidal Thoughts:  No  Homicidal Thoughts:  No  Memory:  Immediate;   Good Recent;   Good  Judgement:  Intact  Insight:  Present  Psychomotor Activity:  Normal  Concentration:  Concentration: Good and Attention Span: Good  Recall:  Good  Fund of Knowledge:  Good  Language:  Good   Akathisia:  No  Handed:  Right  AIMS (if indicated):     Assets:  Communication Skills Desire for Improvement Housing Social Support Transportation  ADL's:  Intact  Cognition:  WNL  Sleep:        Demographic Factors:  Caucasian  Loss Factors: NA  Historical Factors: Impulsivity  Risk Reduction Factors:   Sense of responsibility to family, Religious beliefs about death, Living with another person, especially a relative and Positive social support  Continued Clinical Symptoms:  Previous Psychiatric Diagnoses and Treatments  Cognitive Features That Contribute To Risk:  None    Suicide Risk:  Minimal: No identifiable suicidal ideation.  Patients presenting with no risk factors but with morbid ruminations; may be classified as minimal risk based on the severity of the depressive symptoms    Plan Of Care/Follow-up recommendations:  Activity:  As tolerated Diet:  Heart healthy    Discharge Instructions     For your behavioral health needs, you are advised to continue treatment with [facility]:       [facility]      [address]      [place], [place] [address]      [phone]    Disposition: No evidence of imminent risk to self or others at present.   Patient does not meet criteria for psychiatric inpatient admission. Supportive therapy provided about ongoing stressors. Discussed crisis plan, support from social network, calling 911, coming to the Emergency Department, and calling Suicide Hotline. Roiza Wiedel, NP 12/22/2019, 11:57 AM

## 2019-12-22 NOTE — BH Assessment (Addendum)
BHH Assessment Progress Note  Per Shuvon Rankin, FNP, this pt does not require psychiatric hospitalization at this time.  Pt is to be discharged from [facility] with recommendation to continue treatment with her current outpatient provider, [facility] in [place].  It is preferred that pt have a follow up appointment scheduled for her prior to discharged, however, pt it to be discharged no later than 14:00.  At 11:51 this Clinical research associate called the office.  Call rolled to voice mail and I left a message with request along with my call back number.  As of this writing, return call is pending.  Discharge instructions advise pt to continue treatment with [facility].  Appointment time will be inserted if received in a timely manner.  Pt's nurse has been notified.  Doylene Canning, MA Triage Specialist [phone]   Addendum:  At 13:14 [facility] calls back.  Pt is scheduled for a follow-up appointment with Juanito Doom, MD on Wednesday 01/13/2020 at 8:40 am.  This has been included in pt's discharge instructions.  Pt's nurse has been notified.  Doylene Canning, [place] Behavioral Health Coordinator [phone]

## 2019-12-22 NOTE — ED Notes (Signed)
Hold DC until Stephanie Lowery- counselor has made arrangements for follow up. Stephanie Lowery to make this RN aware when pt is able to be DC'ed

## 2019-12-22 NOTE — Discharge Instructions (Addendum)
For your behavioral health needs, you are advised to continue treatment with [facility].  You have a follow-up appointment scheduled with Juanito Doom, MD on Wednesday, Jan 13, 2020 at 8:40 am:       [facility]      [address]      [place], [place] [address]      [phone]

## 2019-12-22 NOTE — ED Notes (Signed)
Pt ambulated to restroom without difficulty

## 2019-12-22 NOTE — ED Notes (Signed)
Pt provided with meal tray.

## 2019-12-22 NOTE — ED Provider Notes (Signed)
Patient seen this morning.    She has no specific acute complaint.  She is awaiting inpatient placement.  No significant overnight events per nursing.     Wynetta Fines, MD 12/22/19 [phone]

## 2019-12-22 NOTE — ED Notes (Signed)
Pt given a ham sandwich and a drink.

## 2019-12-27 ENCOUNTER — Observation Stay (HOSPITAL_COMMUNITY)
Admission: AD | Admit: 2019-12-27 | Discharge: 2019-12-28 | Disposition: A | Discharge status: Other Acute Inpatient Hospital | Payer: Medicare Other | Attending: Psychiatry | Admitting: Psychiatry

## 2019-12-27 ENCOUNTER — Encounter (HOSPITAL_COMMUNITY): Payer: Self-pay | Admitting: Psychiatry

## 2019-12-27 ENCOUNTER — Other Ambulatory Visit: Payer: Self-pay

## 2019-12-27 DIAGNOSIS — F332 Major depressive disorder, recurrent severe without psychotic features: Secondary | ICD-10-CM | POA: Diagnosis present

## 2019-12-27 DIAGNOSIS — Z20822 Contact with and (suspected) exposure to covid-19: Secondary | ICD-10-CM | POA: Insufficient documentation

## 2019-12-27 DIAGNOSIS — F319 Bipolar disorder, unspecified: Secondary | ICD-10-CM | POA: Diagnosis not present

## 2019-12-27 DIAGNOSIS — Z79899 Other long term (current) drug therapy: Secondary | ICD-10-CM | POA: Insufficient documentation

## 2019-12-27 LAB — RESPIRATORY PANEL BY RT PCR (FLU A&B, COVID)
Influenza A by PCR: NEGATIVE
Influenza B by PCR: NEGATIVE
SARS Coronavirus 2 by RT PCR: NEGATIVE

## 2019-12-27 MED ORDER — QUETIAPINE FUMARATE 300 MG PO TABS
300.0000 mg | ORAL_TABLET | Freq: Every day | ORAL | Status: DC
  Filled 2019-12-27: qty 1

## 2019-12-27 MED ORDER — HYDROXYZINE HCL 25 MG PO TABS
25.0000 mg | ORAL_TABLET | Freq: Three times a day (TID) | ORAL | Status: DC | PRN
  Administered 2019-12-27: 11:00:00 25 mg via ORAL
  Filled 2019-12-27: qty 1

## 2019-12-27 MED ORDER — GABAPENTIN 300 MG PO CAPS
300.0000 mg | ORAL_CAPSULE | Freq: Three times a day (TID) | ORAL | Status: DC
  Administered 2019-12-27 – 2019-12-28 (×4): 300 mg via ORAL
  Filled 2019-12-27 (×4): qty 1

## 2019-12-27 MED ORDER — ACETAMINOPHEN 325 MG PO TABS
650.0000 mg | ORAL_TABLET | Freq: Four times a day (QID) | ORAL | Status: DC | PRN
  Administered 2019-12-27: 650 mg via ORAL
  Filled 2019-12-27: qty 2

## 2019-12-27 MED ORDER — PRAMIPEXOLE DIHYDROCHLORIDE 1 MG PO TABS
1.0000 mg | ORAL_TABLET | Freq: Every day | ORAL | Status: DC
  Filled 2019-12-27 (×3): qty 1

## 2019-12-27 MED ORDER — MAGNESIUM HYDROXIDE 400 MG/5ML PO SUSP: 30.0000 mL | Freq: Every day | ORAL | Status: DC | PRN

## 2019-12-27 MED ORDER — ALUM & MAG HYDROXIDE-SIMETH 200-200-20 MG/5ML PO SUSP: 30.0000 mL | ORAL | Status: DC | PRN

## 2019-12-27 NOTE — Progress Notes (Signed)
54 yo female accompanied by [employer], crying hysterically, loud, stating, "I want to die". Patients mother by patient side. [employer] stated, "we found her lying in traffic asking [place] to shoot her. She kept making remarks that she wanted to kill herself". Patient endorsed wanting to kill herself and when she leave the facility, "I'm going to kill myself so that I won't be a burden to my family any longer". Patient stated, "Dr. Jama Flavors has been my doctor for 9 years now. He will know what to do. I need help, please". RN asked pt if she wanted help to live or to die. Pt responded, "to live. I hate feeling like this". Pt was disheveled, barefoot. Slip resistant socks placed on patient feet. Pt appreciative. Pt contracted for safety while in facility but states, "If y'all release me, I think I will kill myself". Pt placed in observation until transport to [facility] tomorrow am. Pt appreciative for "the help". Pt left resting in room with eyes closed. Q15 min checks initiated; patient remains safe on the unit.

## 2019-12-27 NOTE — Progress Notes (Signed)
CSW faxed COVID results to [facility].    Ruthann Cancer MSW, LCSWA Clincal Social Worker Disposition  [facility] Ph: [phone] Fax: [phone]

## 2019-12-27 NOTE — Progress Notes (Signed)
Patient meets criteria for inpatient treatment per Hillery Jacks, NP. No appropriate beds at [facility] currently. CSW faxed referrals to the following facilities for review:  [facility]  [facility]  [facility]   [facility]   [facility]  [facility]   [facility]  [facility]   [facility]   [facility]  [facility]    TTS will continue to seek bed placement.     Ruthann Cancer MSW, [facility] Clincal Social Worker Disposition  [facility] Ph: [phone] Fax: [phone] 12/27/2019 11:01 AM

## 2019-12-27 NOTE — Plan of Care (Signed)
BHH Observation Crisis Plan  Reason for Crisis Plan:  Crisis Stabilization   Plan of Care:  Referral for Telepsychiatry/Psychiatric Consult  Family Support:      Current Living Environment:  Living Arrangements: Parent  Insurance:   Hospital Account    Name Acct ID Class Status Primary Coverage   Stephanie Lowery, Stephanie Lowery [id number] BEHAVIORAL HEALTH OBSERVATION Open UNITED HEALTHCARE MEDICARE - UHC MEDICARE        Guarantor Account (for Hospital Account [id number])    Name Relation to Pt Service Area Active? Acct Type   Stephanie Lowery Self [facility] Yes Behavioral Health   Address Phone       Stephanie Lowery [place], [place] [address] [phone](H)          Coverage Information (for Hospital Account [id number])    F/O Payor/Plan Precert #   [facility] MEDICARE/UHC MEDICARE    Subscriber Subscriber #   Stephanie Lowery, Stephanie Lowery [id number]   Address Phone   [address] [place], [place] [address] [phone]      Legal Guardian:  Legal Guardian: Other:(Self)  Primary Care Provider:  Silva Bandy, MD  Current Outpatient Providers:  unknown  Psychiatrist:  Name of Psychiatrist: [facility]  Counselor/Therapist:  Name of Therapist: none  Compliant with Medications:  Yes  Additional Information:   Prentice Docker 4/18/20211:07 PM

## 2019-12-27 NOTE — H&P (Addendum)
Behavioral Health Medical Screening Exam  Stephanie Lowery is an 54 y.o. female. Patient presented to [facility] as a walk-in reporting struggling with depression and suicidal ideations for the past 30 years. Reported she is interested in attending group sessions. " like on the 3 hall ways, where MD. Cobos works." Patient stated her father was in a auto accident and feels like she can't help him. Reported she is taken her medications as directed. States she hasn't been able to keep her follow-up with MD. [name] " no one has called me back and I have a hard time with transportation." stated she has completed TMS and ECT in the past. Discusses patient to follow-up with Partial Hospitalization or Instensive Outpatient programing. advised patient these session are virtual. Patient was receptive. ( however she did decline flyer) Support, encouragement and reassurance was provided.   Total Time spent with patient: 15 minutes  Psychiatric Specialty Exam: Physical Exam  Constitutional: She is oriented to person, place, and time. She appears well-developed.  Neurological: She is alert and oriented to person, place, and time.  Psychiatric: She has a normal mood and affect. Her behavior is normal.    Review of Systems  Psychiatric/Behavioral: Positive for decreased concentration. The patient is nervous/anxious.   All other systems reviewed and are negative.   Blood pressure (!) 121/92, pulse 98, temperature 98 F (36.7 C), temperature source Oral, resp. rate 17, last menstrual period 11/25/2014, SpO2 99 %.There is no height or weight on file to calculate BMI.  General Appearance: Casual  Eye Contact:  Good  Speech:  Clear and Coherent  Volume:  Normal  Mood:  Anxious and Depressed  Affect:  Congruent  Thought Process:  Coherent  Orientation:  Full (Time, Place, and Person)  Thought Content:  Logical  Suicidal Thoughts:  Yes.  with intent/plan " Ive been depressed for 30years"   Homicidal Thoughts:  No   Memory:  Immediate;   Fair Recent;   Fair  Judgement:  Fair  Insight:  Fair  Psychomotor Activity:  Normal  Concentration: Concentration: Fair  Recall:  [employer] of Knowledge:Fair  Language: Fair  Akathisia:  No  Handed:  Right  AIMS (if indicated):     Assets:  Communication Skills Desire for Improvement Resilience Social Support  Sleep:       Musculoskeletal: Strength & Muscle Tone: within normal limits Gait & Station: normal Patient leans: N/A  Blood pressure (!) 121/92, pulse 98, temperature 98 F (36.7 C), temperature source Oral, resp. rate 17, last menstrual period 11/25/2014, SpO2 99 %.  Recommendations: PHP or IOP  Based on my evaluation the patient does not appear to have an emergency medical condition.  Addendum at 10:30- Patient was escorted by local Police Department for reports of " laying in the street."  Patient to be admitted for overnight observation and  stabilization and to be faxed out for inpatient admission.  -CSW to fax patient for inpatient admission   Oneta Rack, NP 12/27/2019, 9:50 AM

## 2019-12-27 NOTE — BHH Counselor (Signed)
Pt returned to [facility] after laying down in street upon discharge.  Pt is now being admitted to OBS per T. Melvyn Neth, NP.

## 2019-12-27 NOTE — Progress Notes (Signed)
Pt accepted to [facility] Dr. Estill Cotta is the accepting provider.  Call report to [phone] Pt is Voluntary Pt may be transported by [employer] Pt scheduled to arrive on Monday 12/28/2019 after 8am.  Pt will need COVID test completed and faxed to [phone] before transport.    Ruthann Cancer MSW, [facility] Clincal Social Worker Disposition  [facility] Ph: [phone] Fax: [phone]  12/27/2019 11:14 AM

## 2019-12-27 NOTE — BH Assessment (Signed)
Assessment Note  Stephanie Lowery is a 54 y.o. female who presented to [facility] as a voluntary walk-in with complaint of suicidal ideation, despondency, and other symptoms.  She lives in [place] with her parents, and she is unemployed and on disability.  Pt receives outpatient psych services through [facility].  Pt was last assessed by TTS on December 21, 2019.  Pt reported that she is suicidal and has been for three days.  Trigger is conflict with her family (parents).  Pt reported that she stopped eating three days ago, and her hope is that she will starve to death.  Pt also endorsed five previous suicide attempts.  In addition to suicidal ideation, Pt endorsed despondency, tearfulness, isolation, fatigue, poor concentration, hopelessness, and irritability.  Pt denied hallucination, homicidal ideation, current self-injurious behavior, and substance use concerns.    Pt was last assessed by TTS on April 12th.  At that time, Pt presented with altered mental status due to overdose on Xanax (per her report).  During assessment, Pt presented as alert and oriented.  She had good eye contact.  She was dressed in street clothes and appeared appropriately groomed.  Pt's mood was depressed, and affect was mood-congruent.  Pt's speech was normal in rate, rhythm, and volume.  Pt's thought processes were within normal range, and thought content was logical and goal-oriented.  There was no evidence of delusion.  Pt's impulse control and insight were fair.  Judgment was fair.  Consulted with Chilton Greathouse, NP, who also assessed Pt.  Pt expressed interest in partial hospitalization programs and appointment with Stephanie Havers, MD.  Pt provided information and is psych-cleared.  Diagnosis: Bipolar I Disorder, Depressed  Past Medical History:  Past Medical History:  Diagnosis Date  . Anxiety   . Depression     Past Surgical History:  Procedure Laterality Date  . GALLBLADDER SURGERY      Family History: No family  history on file.  Social History:  reports that she has never smoked. She has never used smokeless tobacco. She reports that she does not drink alcohol or use drugs.  Additional Social History:  Alcohol / Drug Use Pain Medications: See MAR Prescriptions: See MAR Over the Counter: See MAR History of alcohol / drug use?: No history of alcohol / drug abuse  CIWA:   COWS:    Allergies:  Allergies  Allergen Reactions  . Abilify [Aripiprazole] Other (See Comments)    "Made my mouth move involuntarily"  . Ambien [Zolpidem Tartrate] Other (See Comments)    jittery  . Geodon [Ziprasidone Hcl] Other (See Comments)    "Made my mouth move involuntarily"  . Haldol [Haloperidol Lactate]     Jittery, "makes me crazy"  . Lactose Intolerance (Gi) Diarrhea  . Lunesta [Eszopiclone]     Jittery   . Promethazine     jittery  . Trazodone And Nefazodone Other (See Comments)    Migraines     Home Medications: (Not in a hospital admission)   OB/GYN Status:  Patient's last menstrual period was 11/25/2014 (exact date).  General Assessment Data Location of Assessment: [facility] Assessment Services TTS Assessment: In system Is this a Tele or Face-to-Face Assessment?: Face-to-Face Is this an Initial Assessment or a Re-assessment for this encounter?: Initial Assessment Patient Accompanied by:: N/A Language Other than English: No Living Arrangements: Other (Comment)(Single family home) What gender do you identify as?: Female Marital status: Single Maiden name: Rauber Pregnancy Status: No Living Arrangements: Parent Can pt return to current living arrangement?:  Yes Admission Status: Voluntary Is patient capable of signing voluntary admission?: Yes Referral Source: Self/Family/Friend Insurance type: [facility]     Crisis Care Plan Living Arrangements: Parent Legal Guardian: Other:(Self) Name of Psychiatrist: [facility] Name of Therapist: none  Education Status Is patient currently in  school?: No Is the patient employed, unemployed or receiving disability?: Receiving disability income, Unemployed  Risk to self with the past 6 months Suicidal Ideation: Yes-Currently Present Has patient been a risk to self within the past 6 months prior to admission? : Yes Suicidal Intent: Yes-Currently Present Has patient had any suicidal intent within the past 6 months prior to admission? : No Is patient at risk for suicide?: Yes Suicidal Plan?: Yes-Currently Present Has patient had any suicidal plan within the past 6 months prior to admission? : Yes Specify Current Suicidal Plan: Pt stated she is starving herself to death Access to Means: Yes Specify Access to Suicidal Means: Overdose on meds What has been your use of drugs/alcohol within the last 12 months?: Denied Previous Attempts/Gestures: Yes How many times?: 5 Other Self Harm Risks: None indicated Triggers for Past Attempts: Unpredictable Intentional Self Injurious Behavior: Cutting Comment - Self Injurious Behavior: History of cutting, though not recently Family Suicide History: Unknown Recent stressful life event(s): Conflict (Comment)(Conflict with parents) Persecutory voices/beliefs?: No Depression: Yes Depression Symptoms: Despondent, Tearfulness, Isolating, Fatigue, Loss of interest in usual pleasures, Feeling worthless/self pity, Feeling angry/irritable Substance abuse history and/or treatment for substance abuse?: No Suicide prevention information given to non-admitted patients: Not applicable  Risk to Others within the past 6 months Homicidal Ideation: No Does patient have any lifetime risk of violence toward others beyond the six months prior to admission? : No Thoughts of Harm to Others: No Current Homicidal Intent: No Current Homicidal Plan: No Access to Homicidal Means: No Identified Victim: None History of harm to others?: No Assessment of Violence: None Noted Does patient have access to weapons?:  No Criminal Charges Pending?: No Does patient have a court date: No Is patient on probation?: No  Psychosis Hallucinations: None noted Delusions: None noted  Mental Status Report Appearance/Hygiene: Unremarkable, Other (Comment)(Street clothes) Eye Contact: Fair Motor Activity: Freedom of movement, Unremarkable Speech: Logical/coherent Level of Consciousness: Alert, Crying Mood: Depressed Affect: Appropriate to circumstance Anxiety Level: Moderate Thought Processes: Coherent, Relevant Judgement: Partial Orientation: Person, Place, Time, Situation Obsessive Compulsive Thoughts/Behaviors: None  Cognitive Functioning Concentration: Fair Memory: Recent Intact, Remote Intact Is patient IDD: No Insight: Poor Impulse Control: Poor Appetite: Poor Have you had any weight changes? : (Pt hasn't eaten in 3 days) Sleep: No Change Vegetative Symptoms: None  ADLScreening [facility] Assessment Services) Patient's cognitive ability adequate to safely complete daily activities?: Yes Patient able to express need for assistance with ADLs?: Yes Independently performs ADLs?: Yes (appropriate for developmental age)  Prior Inpatient Therapy Prior Inpatient Therapy: Yes Prior Therapy Dates: 2016, 2015 Prior Therapy Facilty/Provider(s): [facility] Reason for Treatment: Bipolar I  Prior Outpatient Therapy Prior Outpatient Therapy: Yes Prior Therapy Dates: ongoing Prior Therapy Facilty/Provider(s): [facility] Reason for Treatment: med mangement Does patient have an ACCT team?: No Does patient have Intensive In-House Services?  : No Does patient have [facility] services? : No Does patient have P4CC services?: No  ADL Screening (condition at time of admission) Patient's cognitive ability adequate to safely complete daily activities?: Yes Is the patient deaf or have difficulty hearing?: No Does the patient have difficulty seeing, even when wearing glasses/contacts?: No Does the patient have  difficulty concentrating, remembering,  or making decisions?: No Patient able to express need for assistance with ADLs?: Yes Does the patient have difficulty dressing or bathing?: No Independently performs ADLs?: Yes (appropriate for developmental age) Does the patient have difficulty walking or climbing stairs?: No Weakness of Legs: None Weakness of Arms/Hands: None  Home Assistive Devices/Equipment Home Assistive Devices/Equipment: None  Therapy Consults (therapy consults require a physician order) PT Evaluation Needed: No OT Evalulation Needed: No SLP Evaluation Needed: No Abuse/Neglect Assessment (Assessment to be complete while patient is alone) Abuse/Neglect Assessment Can Be Completed: Yes Physical Abuse: Denies Verbal Abuse: Denies Sexual Abuse: Denies Exploitation of patient/patient's resources: Denies Self-Neglect: Denies Values / Beliefs Cultural Requests During Hospitalization: None Spiritual Requests During Hospitalization: None Consults Spiritual Care Consult Needed: No Advance Directives (For Healthcare) Does Patient Have a Medical Advance Directive?: No          Disposition:  Disposition Initial Assessment Completed for this Encounter: Yes  On Site Evaluation by:   Reviewed with Physician:    Laurena Slimmer Avanni Turnbaugh 12/27/2019 9:19 AM

## 2019-12-28 DIAGNOSIS — F319 Bipolar disorder, unspecified: Secondary | ICD-10-CM | POA: Diagnosis not present

## 2019-12-28 MED ORDER — GABAPENTIN 300 MG PO CAPS: 300.0000 mg | ORAL_CAPSULE | Freq: Three times a day (TID) | ORAL | Status: AC

## 2019-12-28 MED ORDER — CARBAMAZEPINE 200 MG PO TABS
100.0000 mg | ORAL_TABLET | Freq: Two times a day (BID) | ORAL | Status: DC
  Filled 2019-12-28 (×3): qty 0.5

## 2019-12-28 MED ORDER — FLUOXETINE HCL 10 MG PO CAPS: 10.0000 mg | ORAL_CAPSULE | Freq: Every day | ORAL | 3 refills | Status: AC

## 2019-12-28 MED ORDER — CARBAMAZEPINE 100 MG PO CHEW
100.0000 mg | CHEWABLE_TABLET | Freq: Two times a day (BID) | ORAL | Status: DC
  Administered 2019-12-28: 100 mg via ORAL
  Filled 2019-12-28 (×2): qty 1

## 2019-12-28 MED ORDER — FLUOXETINE HCL 10 MG PO CAPS: 10.0000 mg | ORAL_CAPSULE | Freq: Every day | ORAL | Status: DC

## 2019-12-28 MED ORDER — CARBAMAZEPINE 100 MG PO CHEW: 100.0000 mg | CHEWABLE_TABLET | Freq: Two times a day (BID) | ORAL | 0 refills | Status: AC

## 2019-12-28 NOTE — Progress Notes (Signed)
D Alert and oriented x 3 presents this shift with passive SI Denies HI .    A.Scheduled medications administered per Provider order. Support and encouragement provided. Routine safety checks conducted every 15 minutes. Patient notified to inform staff with problems or concerns. Refused scheduled HS medication.C/O Headache PRN Tylenol given and effective. (see mar) Pt educated on her medication compliance. Patient contracts for safety at this time. Will continue to monitor Patient.

## 2019-12-28 NOTE — Progress Notes (Signed)
Pt resting in bed through night with with eyes closed unlabored respirations. Safety maintained with q15 min rounds.   Pt notified staff that she was incontinent of bowel during the night, supplies provided for pt to get cleaned up. Pt reported that she often has this issue and wears Depends regularly to help manage it.    Monitoring continues.

## 2019-12-28 NOTE — Progress Notes (Signed)
Report called to [facility] nurse accepting pt for admission, Pervis Hocking, RN.

## 2019-12-28 NOTE — Progress Notes (Signed)
Regina from [facility] called Hospital doctor to see if a copy of the EMTALA form could be printed and faxed to them for the pt to go to [facility], For some reason they can not print form. Form printed and faxed to [place] at [phone].

## 2019-12-28 NOTE — Progress Notes (Signed)
Pt discharged at this time; left with [facility], to be transported to [facility], left with IVC paperwork and EMTALA paperwork, copy of the [facility], MD's progress note, and Facesheet. Pt had no belongings brought in, reported she had only the clothes she came in with, pt reports those were thrown away by pt prior to this shift due to soiling. Pt upon discharge is alert and oriented to person, place, time and situation. PT was cooperative with the discharge process, was calm, no distress noted or reported.

## 2019-12-28 NOTE — BH Assessment (Signed)
[facility] Assessment Progress Note  Pt has been accepted to [facility].  For details please see note authored by Dennison Mascot, CSW on 12/27/2019 at 11:16.  Pt reports that she wants to be discharged.  Nehemiah Massed, MD finds that pt meets criteria for IVC, which he has initiated.  IVC documents have been faxed to [facility], and at [employer] confirms receipt.  He has since faxed Findings and Custody Order to this Clinical research associate.  At 11:21 I called [employer] and spoke to [employer], who took demographic information, agreeing to dispatch law enforcement to fill out Return of Service.  IVC documents may be found on pt's chart, and they have faxed to [facility].  Transfer is pending arrival of law enforcement to serve Findings and Custody Order.  Pt's nurse, Ander Slade, has been notified.  Doylene Canning, [place] [facility] Coordinator [phone]

## 2019-12-28 NOTE — Progress Notes (Signed)
Pt received at start of shift, resting quietly in her room. Pt is alert and oriented to person, place, time and situation, is calm, cooperative, medication complaint, denies suicidal and homicidal ideation, denies hallucinations, will continue to monitor pt per Q15 minute face checks and monitor for safety and progress.

## 2019-12-28 NOTE — Progress Notes (Addendum)
Patient ID: Stephanie Lowery, female   DOB: 1966-08-31, 54 y.o.   MRN: [id number]  12/28/2019 at 7:35 AM Observation unit progress note  54 year old female.  Patient is known to me from prior treatment -Stephanie Lowery was an outpatient under my care until 6 years ago, in [place], [place].  Stephanie Lowery presented to the hospital on 4/12 reporting Xanax overdose.  Stated Stephanie Lowery had taken 6 or seven 1 mg tablets.  Stephanie Lowery was vague about whether this was with suicidal intention at the time.  Stephanie Lowery was psychiatrically cleared and discharged with a plan of following up with her current outpatient provider, Dr. Luan Pulling, in [place] .  Patient presented to [facility] again on 4/18 reporting intermittent suicidal ideations, depression, neuro-vegetative symptoms. Stephanie Lowery reported Stephanie Lowery had not been eating recently with thoughts of starving to death. Was evaluted and recommended for PHP. After leaving [facility] patient reportedly lay down on a nearby street , and was brought back to hospital.  Stephanie Lowery reports Stephanie Lowery has been struggling with worsening depression, exacerbated by stressors - elderly mother has cancer , poor relationship with her brother. Endorses poor sleep, poor appetite, low energy level, thoughts of death/ thoughts of starving herself . Denies homicidal ideations but states Stephanie Lowery has had thoughts of killing her pet cat. Denies psychotic symptoms. States " I don't think I am on the right medications , I feel terrible ".   Patient has a long history of mood disorder - Stephanie Lowery has been diagnosed with MDD and with Bipolar Disorder in the past . Stephanie Lowery has a history of Borderline P.D. features . Stephanie Lowery has had several prior psychiatric admissions, most recently in 2016. At the time was diagnosed with MDD and discharged on Luvox, Abilify, Risperidone.  Stephanie Lowery denies alcohol or substance abuse . Stephanie Lowery is prescribed Xanax, but states Stephanie Lowery takes less than once a week on average and denies abuse or misuse.   Denies medical illnesses.  Stephanie Lowery is currently on  Luvox 50 mgrs QAM and 100 mgrs QHS, Seroquel 300 mgrs QHS, Neurontin 300 mgr AM and 600 mgrs QHS.  Of note, patient reports history of EPS/ involuntary movements , particularly " locked jaw" on several antipsychotic medications Stephanie Lowery has been on in the past, including Risperidone, Geodon, Abilify.  Labs - ( most recent from 4/12) . BMP remarkable for Creat 1.53, BUN 29, GFR 38.  4/10 COVID negative.  4/12 EKG QTc 429  MSE-currently alert, attentive, without psychomotor agitation, depressed, constricted in affect, tearful during most of session, no thought disorder, endorses suicidal ideations, currently without specific plan other than "not eating".  As noted yesterday Stephanie Lowery laid down on the road reporting thoughts of being run over by a vehicle.  No homicidal ideations.  Denies hallucinations, does not appear internally preoccupied, no delusions are expressed.  Diagnoses-bipolar disorder depressed.  Plan-inpatient admission.  Check routine labs, to include CBC, CMP, TSH, UA We discussed medication management options.  Patient has been on several different medication regimens over the years.  At this time states that Luvox and Seroquel did not seem to be working and that her mood is worsening in spite of taking his medications regularly.  Stephanie Lowery remembers a combination of Prozac and Tegretol as helpful and states that when Stephanie Lowery was on this regimen Stephanie Lowery felt more stable and was able to maintain employment for a period of time.   Based on above would discontinue Luvox/Seroquel, start Tegretol/ Prozac.  Would start at low doses initially to minimize risk of side effects. Start Tegretol at  100 mg twice daily and Prozac at 10 mg daily.  Gabriel Earing , MD

## 2019-12-29 NOTE — Discharge Summary (Signed)
Patient transferred to [facility] via law enforcement under IVC.

## 2023-10-16 ENCOUNTER — Other Ambulatory Visit (HOSPITAL_COMMUNITY): Payer: Self-pay
